# Patient Record
Sex: Male | Born: 1970 | Race: Black or African American | Hispanic: No | Marital: Married | Smoking: Former smoker
Health system: Southern US, Community
[De-identification: ages and names within clinical notes are randomized; demographics above are authoritative.]

## PROBLEM LIST (undated history)

## (undated) DIAGNOSIS — N5089 Other specified disorders of the male genital organs: Secondary | ICD-10-CM

## (undated) DIAGNOSIS — R51 Headache: Secondary | ICD-10-CM

## (undated) DIAGNOSIS — R519 Headache, unspecified: Secondary | ICD-10-CM

## (undated) DIAGNOSIS — N390 Urinary tract infection, site not specified: Secondary | ICD-10-CM

## (undated) DIAGNOSIS — M069 Rheumatoid arthritis, unspecified: Secondary | ICD-10-CM

## (undated) DIAGNOSIS — M1990 Unspecified osteoarthritis, unspecified site: Secondary | ICD-10-CM

## (undated) HISTORY — PX: BACK SURGERY: SHX140

---

## 2000-06-18 ENCOUNTER — Emergency Department (HOSPITAL_COMMUNITY): Admission: EM | Admit: 2000-06-18 | Discharge: 2000-06-18 | Payer: Self-pay | Admitting: *Deleted

## 2000-12-07 ENCOUNTER — Emergency Department (HOSPITAL_COMMUNITY): Admission: EM | Admit: 2000-12-07 | Discharge: 2000-12-07 | Payer: Self-pay | Admitting: Emergency Medicine

## 2000-12-09 ENCOUNTER — Emergency Department (HOSPITAL_COMMUNITY): Admission: EM | Admit: 2000-12-09 | Discharge: 2000-12-09 | Payer: Self-pay | Admitting: Emergency Medicine

## 2000-12-18 ENCOUNTER — Encounter: Payer: Self-pay | Admitting: Nephrology

## 2000-12-18 ENCOUNTER — Encounter: Admission: RE | Admit: 2000-12-18 | Discharge: 2000-12-18 | Payer: Self-pay | Admitting: Nephrology

## 2003-03-18 HISTORY — PX: CERVICAL FUSION: SHX112

## 2003-04-14 ENCOUNTER — Encounter: Admission: RE | Admit: 2003-04-14 | Discharge: 2003-04-14 | Payer: Self-pay | Admitting: Nephrology

## 2003-09-22 ENCOUNTER — Encounter: Admission: RE | Admit: 2003-09-22 | Discharge: 2003-09-22 | Payer: Self-pay | Admitting: Family Medicine

## 2003-11-11 ENCOUNTER — Ambulatory Visit (HOSPITAL_COMMUNITY): Admission: RE | Admit: 2003-11-11 | Discharge: 2003-11-11 | Payer: Self-pay | Admitting: Family Medicine

## 2004-01-02 ENCOUNTER — Ambulatory Visit (HOSPITAL_COMMUNITY): Admission: RE | Admit: 2004-01-02 | Discharge: 2004-01-03 | Payer: Self-pay | Admitting: Neurological Surgery

## 2005-11-09 ENCOUNTER — Encounter: Admission: RE | Admit: 2005-11-09 | Discharge: 2005-11-09 | Payer: Self-pay | Admitting: *Deleted

## 2007-03-18 HISTORY — PX: LUMBAR DISC SURGERY: SHX700

## 2007-03-28 ENCOUNTER — Emergency Department (HOSPITAL_COMMUNITY): Admission: EM | Admit: 2007-03-28 | Discharge: 2007-03-28 | Payer: Self-pay | Admitting: Emergency Medicine

## 2007-04-28 ENCOUNTER — Ambulatory Visit (HOSPITAL_COMMUNITY): Admission: RE | Admit: 2007-04-28 | Discharge: 2007-04-29 | Payer: Self-pay | Admitting: Neurosurgery

## 2008-12-20 ENCOUNTER — Encounter: Admission: RE | Admit: 2008-12-20 | Discharge: 2008-12-20 | Payer: Self-pay | Admitting: Neurology

## 2009-03-17 HISTORY — PX: HIP ARTHROPLASTY: SHX981

## 2009-03-18 ENCOUNTER — Emergency Department (HOSPITAL_COMMUNITY): Admission: EM | Admit: 2009-03-18 | Discharge: 2009-03-18 | Payer: Self-pay | Admitting: Emergency Medicine

## 2009-05-04 ENCOUNTER — Encounter: Admission: RE | Admit: 2009-05-04 | Discharge: 2009-05-04 | Payer: Self-pay | Admitting: Sports Medicine

## 2009-05-30 ENCOUNTER — Inpatient Hospital Stay (HOSPITAL_COMMUNITY): Admission: RE | Admit: 2009-05-30 | Discharge: 2009-06-01 | Payer: Self-pay | Admitting: Orthopedic Surgery

## 2009-06-08 ENCOUNTER — Encounter (INDEPENDENT_AMBULATORY_CARE_PROVIDER_SITE_OTHER): Payer: Self-pay | Admitting: Orthopedic Surgery

## 2009-06-08 ENCOUNTER — Ambulatory Visit: Payer: Self-pay | Admitting: Vascular Surgery

## 2009-06-08 ENCOUNTER — Ambulatory Visit: Admission: RE | Admit: 2009-06-08 | Discharge: 2009-06-08 | Payer: Self-pay | Admitting: Orthopedic Surgery

## 2009-06-19 ENCOUNTER — Encounter: Admission: RE | Admit: 2009-06-19 | Discharge: 2009-06-19 | Payer: Self-pay | Admitting: Family Medicine

## 2009-06-27 DIAGNOSIS — M51379 Other intervertebral disc degeneration, lumbosacral region without mention of lumbar back pain or lower extremity pain: Secondary | ICD-10-CM | POA: Insufficient documentation

## 2009-06-27 DIAGNOSIS — M5137 Other intervertebral disc degeneration, lumbosacral region: Secondary | ICD-10-CM | POA: Insufficient documentation

## 2009-06-28 ENCOUNTER — Ambulatory Visit: Payer: Self-pay | Admitting: Pulmonary Disease

## 2009-06-28 DIAGNOSIS — R930 Abnormal findings on diagnostic imaging of skull and head, not elsewhere classified: Secondary | ICD-10-CM | POA: Insufficient documentation

## 2009-07-23 ENCOUNTER — Inpatient Hospital Stay (HOSPITAL_COMMUNITY): Admission: RE | Admit: 2009-07-23 | Discharge: 2009-07-25 | Payer: Self-pay | Admitting: Orthopedic Surgery

## 2009-08-03 ENCOUNTER — Ambulatory Visit: Admission: RE | Admit: 2009-08-03 | Discharge: 2009-08-03 | Payer: Self-pay | Admitting: Orthopedic Surgery

## 2009-08-03 ENCOUNTER — Encounter (INDEPENDENT_AMBULATORY_CARE_PROVIDER_SITE_OTHER): Payer: Self-pay | Admitting: Orthopedic Surgery

## 2009-08-03 ENCOUNTER — Ambulatory Visit: Payer: Self-pay | Admitting: Vascular Surgery

## 2009-10-21 IMAGING — CT CT PELVIS W/O CM
2 of 4 series · 17 of 46 positions shown, 19 images · IV contrast (agent unspecified)
Comparison: none

CLINICAL DATA: Hematuria, back pain, left flank pain.
 ABDOMEN CT WITHOUT CONTRAST:
TECHNIQUE: Multidetector CT imaging of the abdomen was performed following the standard protocol without IV contrast.
TECHNIQUE: Multidetector CT imaging of the pelvis was performed following the standard protocol without IV contrast.

[Series 2: stone_wo 5.0 b40f st · axial · 0.81mm/px · z∈[-494,-94]mm · 14 of 110 slices shown, 16 images]
[im 5/110  soft-tissue]
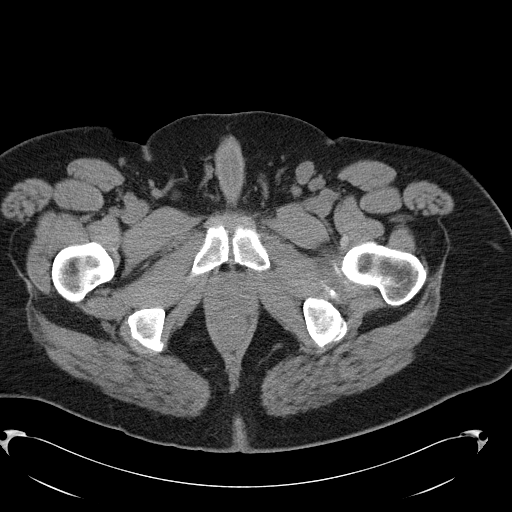
[im 5/110  bone]
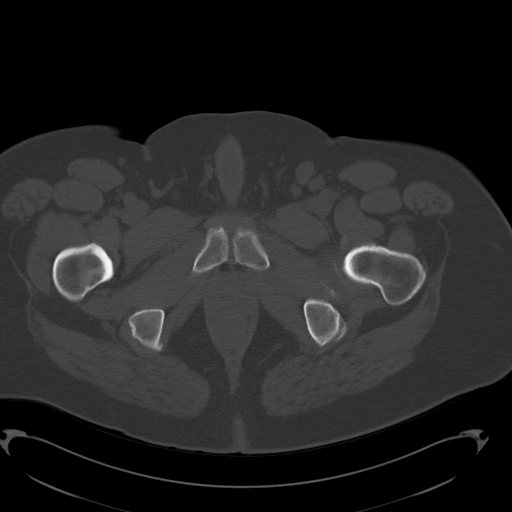
[im 13/110  soft-tissue]
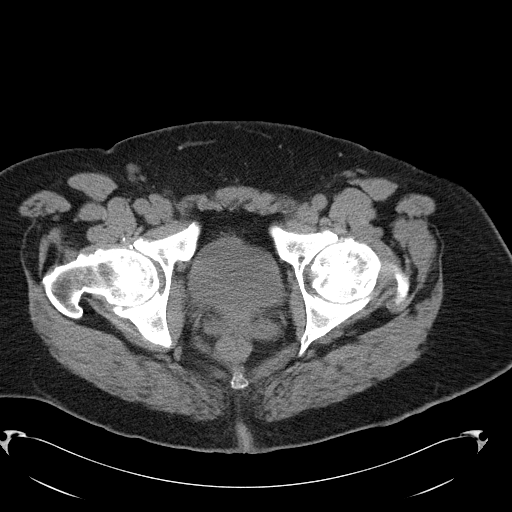
[im 21/110  soft-tissue]
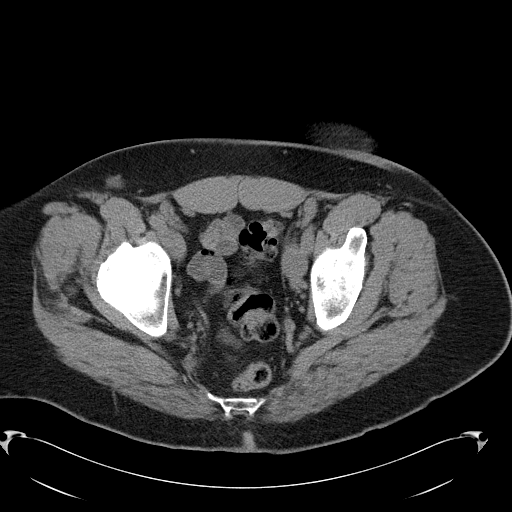
[im 29/110  soft-tissue]
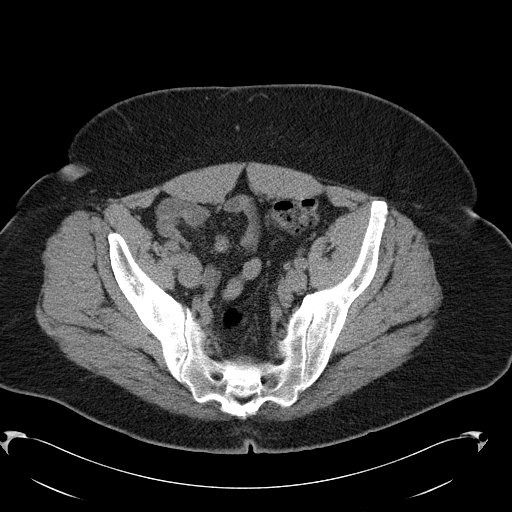
[im 37/110  soft-tissue]
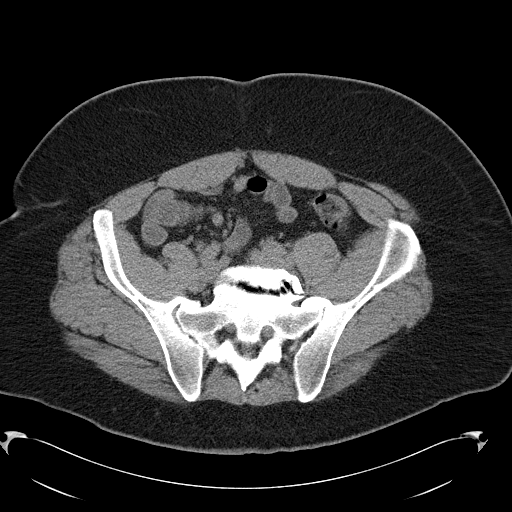
[im 45/110  soft-tissue]
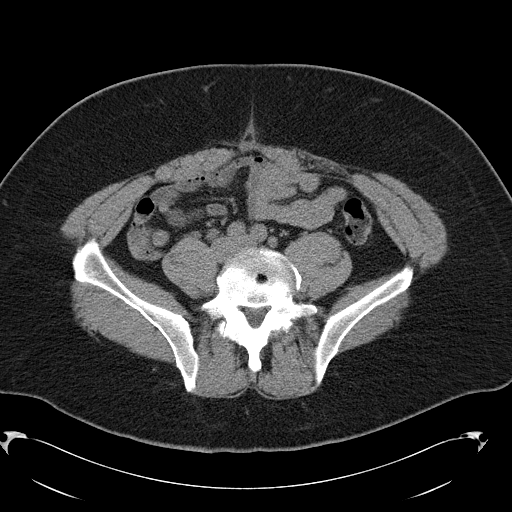
[im 53/110  soft-tissue]
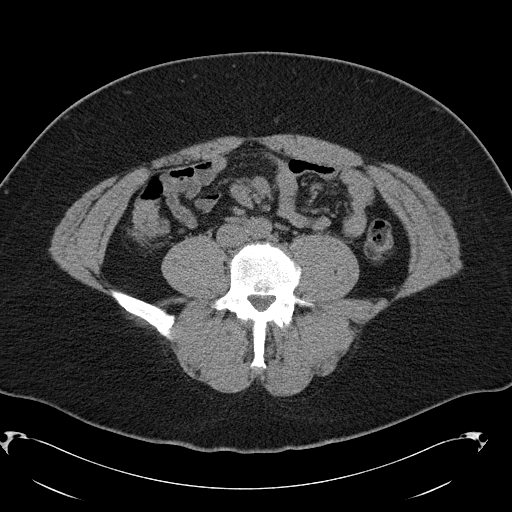
[im 57/110  soft-tissue]
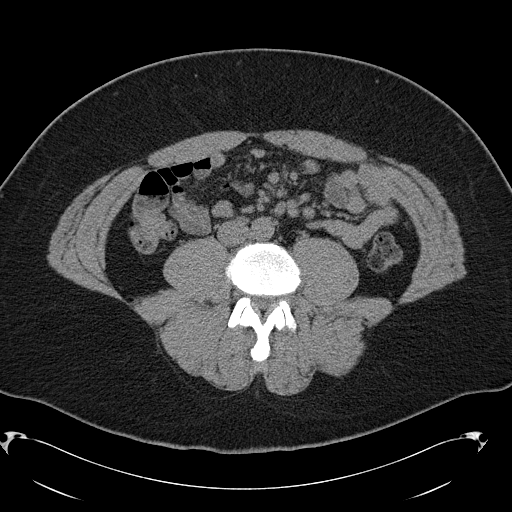
[im 65/110  soft-tissue]
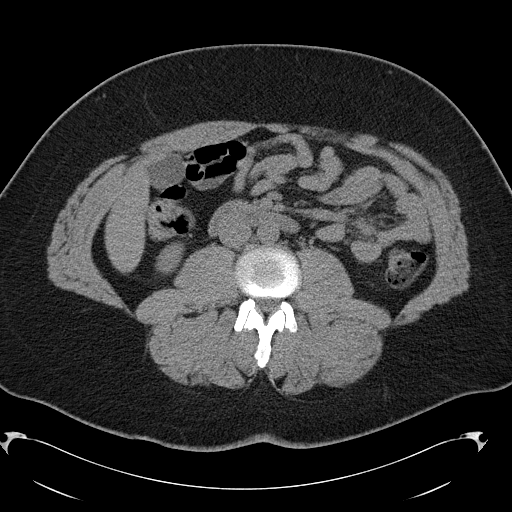
[im 65/110  bone]
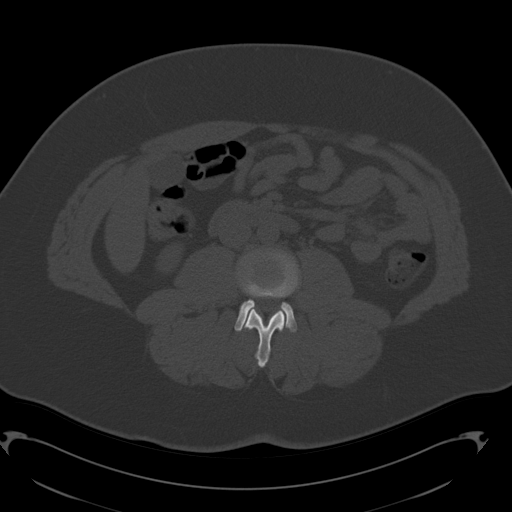
[im 73/110  soft-tissue]
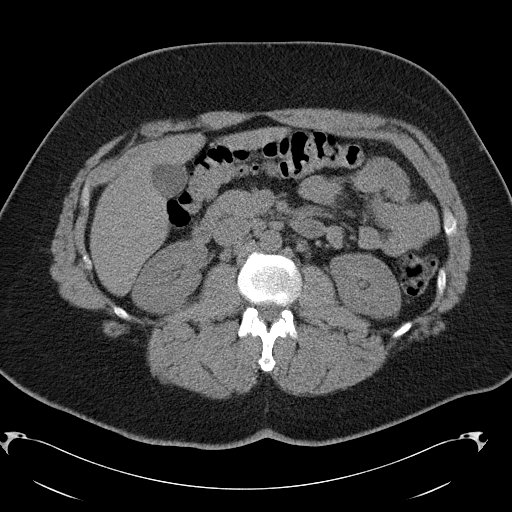
[im 81/110  soft-tissue]
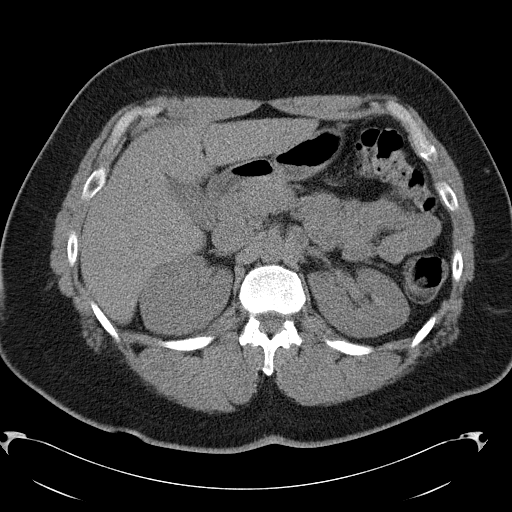
[im 89/110  soft-tissue]
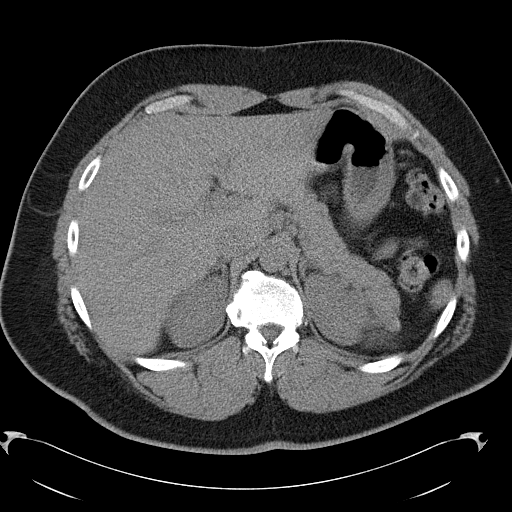
[im 97/110  soft-tissue]
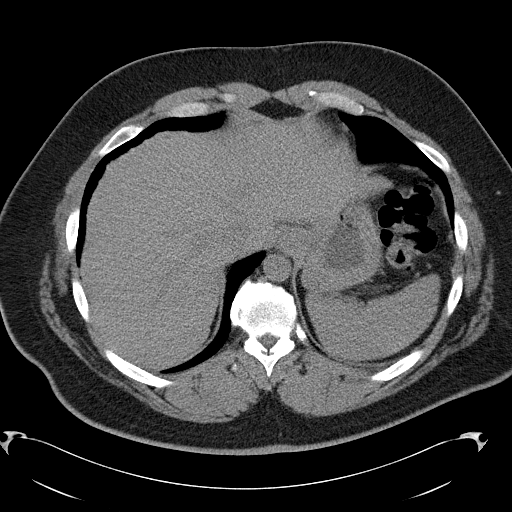
[im 105/110  soft-tissue]
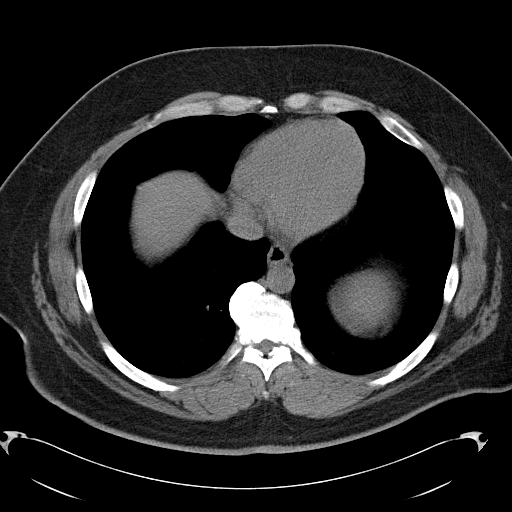

[Series 602: coronal abdomen · coronal · 0.90mm/px · 3 of 139 slices shown]
[im 47/139  soft-tissue]
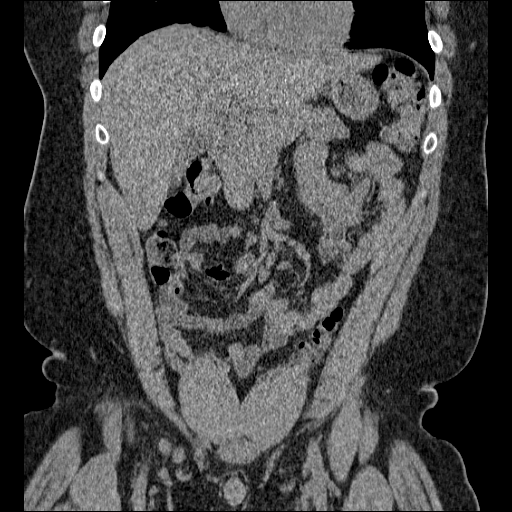
[im 62/139  soft-tissue]
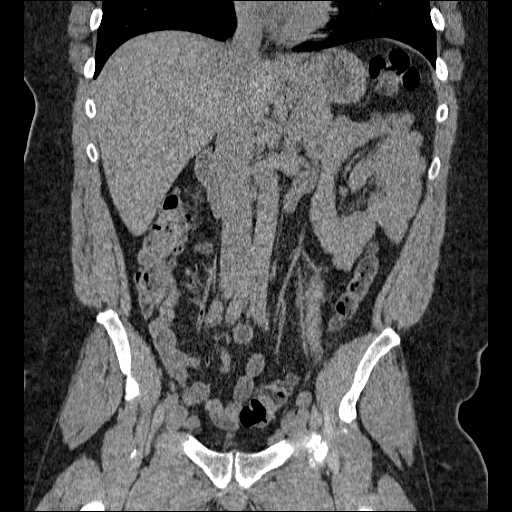
[im 77/139  soft-tissue]
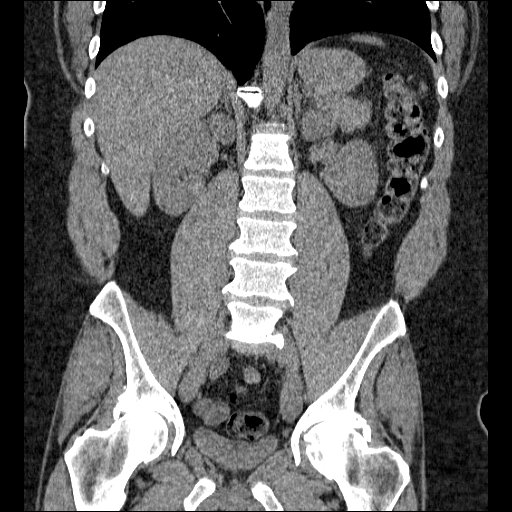

[17 of 46 positions shown; findings below may reference images not displayed]

FINDINGS: There is a 5 mm pleural-based left lower lobe nodule on image 5.  
 There is no hydronephrosis or asymmetric perinephric stranding.  No urinary calculus is present in the abdomen.  
 The liver, gallbladder, spleen, pancreas and adrenal glands are unremarkable.  Negative free fluid.  There is a 1.4 x 2.4 cm soft tissue density adjacent to the aorta on image 30 worrisome for retroperitoneal adenopathy.
IMPRESSION: 1. No urinary calculus.
 2. 5 mm left lower lobe pulmonary nodule.  Followup in three months is recommended to ensure stability.  
 3. Left paraaortic adenopathy.  Inflammatory and neoplastic etiology are in the differential.  At the minimum, followup CT in 3-6 months is warranted to ensure resolution.
 PELVIS CT WITHOUT CONTRAST:
FINDINGS: The appendix is normal.  No urinary calculus is present.   Multifactorial spinal stenosis is noted at L4-5.  There is a left paracentral disc herniation causing severe narrowing of the left lateral recess at L5-S1.  Left-sided facet arthropathy and ligamentum flavum hypertrophy contribute to subarticular lateral recess narrowing.  The disc herniation is somewhat calcified suggesting a chronic nature.  The left S1 nerve root sleeve is effaced.  The bladder and prostate are unremarkable.  Severe degenerative change of the hip joints is present.
IMPRESSION: 1. No urinary calculus.
 2. Degenerative disc disease with spinal stenosis at L4-5.  
 3. Left paracentral disc herniation at L5-S1 compressing the left S1 nerve root sleeve.

## 2010-03-17 HISTORY — PX: BUNIONECTOMY: SHX129

## 2010-04-16 NOTE — Assessment & Plan Note (Signed)
Summary: consult for abnormal ct chest.   Copy to:  Turner Daniels Primary Provider/Referring Provider:  Lupe Carney  CC:  Pulmonary Consult and Abnormal CT pt needs right total hip and needs pulmonary evaluation before surgery.  History of Present Illness: The pt is a 39y/o male who I have been asked to see for an abnormal chest ct.  He recently underwent a left THR, and was noted to have nodular densities on cxr.  He underwent a ct chest, and found to have minimal LN with an upper lobe predominant nodular airspace disease.  He has also been found to have an ACE level of 85, and ESR of 48.  He denies any pulmonary symptoms.  He has no cough, mucus, congestion, or sob.  However, he really does not push himself from an exertional standpoint due to his MSK issues.  He denies any unusual occupational exposures, pets, or mold exposures.  He has worked around Web designer in the past, but has always worn approved respirator devices.  He denies any TB exposure, and has had a negative ppd in the past.  He has no personal history of cancer.  Current Medications (verified): 1)  Baclofen 20 Mg Tabs (Baclofen) .... Take 1 Tablet By Mouth Two Times A Day 2)  Oxycodone Hcl 5 Mg Tabs (Oxycodone Hcl) .... 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  Maternal grandfather heart disease Mother breast Production assistant, radio  Social History: Reviewed history and no changes required. Married with 4 children Disability Former smoker averaged 4 cigarettes per day for 3 years Quit 1998 No alcohol since 1998 , only social  Review of Systems       The patient complains of weight change, tooth/dental problems, and joint stiffness or pain.    Vital Signs:  Patient profile:   40 year old male Height:      76  inches Weight:      328 pounds BMI:     40.07 O2 Sat:      96 % on Room air Temp:     98 degrees F oral Pulse rate:   74 / minute BP sitting:   140 / 84  (left arm)  Vitals Entered By: Renold Genta RCP, LPN (June 28, 2009 3:26 PM)  O2 Sat at Rest %:  96% O2 Flow:  Room air CC: Pulmonary Consult, Abnormal CT pt needs right total hip and needs pulmonary evaluation before surgery Comments Medications reviewed with patient Renold Genta RCP, LPN  June 28, 2009 3:34 PM    Physical Exam  General:  ow male in nad Eyes:  PERRLA and EOMI.   Nose:  patent without discharge Mouth:  clear Neck:  no jvd, tmg, LN Lungs:  totally clear to auscultation Heart:  rrr, no mrg Abdomen:  soft and nontender, bs+ Extremities:  mild edema bilat, no cyanosis pulses intact distally Neurologic:  alert and oriented, moves all 4.   Impression & Recommendations:  Problem # 1:  CT, CHEST, ABNORMAL (ICD-793.1)  The pt has nodular airspace disease bilat the is primarily focused in upper lobes.  The findings, along with is history, is most c/w sarcoidosis.  Would also consider hypersensitivity pneumonitis and eosinophilic granuloma.  At this point, he needs bronchoscopy with BAL and TBBX.  The pt is asymptomatic from  a pulmonary standpoint, is oxygenating well, and is having a lot of hip pain.  I see no reason he cannot have his other hip surgery done before doing procedure for diagnosis of lung disease.  Will send note to Dr. Turner Daniels, and will arrange bronchoscopy in a few weeks.  Other Orders: Consultation Level IV (24401)  Patient Instructions: 1)  please call me week after next to work out time for bronchoscopy.  I will send a note to Dr. Turner Daniels to let him know you are ok for hip surgery.

## 2010-06-02 LAB — URINALYSIS, ROUTINE W REFLEX MICROSCOPIC
Bilirubin Urine: NEGATIVE
Glucose, UA: NEGATIVE mg/dL
Hgb urine dipstick: NEGATIVE
Nitrite: NEGATIVE
Protein, ur: NEGATIVE mg/dL
Urobilinogen, UA: 1 mg/dL (ref 0.0–1.0)

## 2010-06-04 LAB — BASIC METABOLIC PANEL
BUN: 7 mg/dL (ref 6–23)
Chloride: 104 mEq/L (ref 96–112)
Chloride: 107 mEq/L (ref 96–112)
Creatinine, Ser: 0.76 mg/dL (ref 0.4–1.5)
Glucose, Bld: 143 mg/dL — ABNORMAL HIGH (ref 70–99)
Sodium: 142 mEq/L (ref 135–145)

## 2010-06-04 LAB — CBC
HCT: 37.1 % — ABNORMAL LOW (ref 39.0–52.0)
Hemoglobin: 12.4 g/dL — ABNORMAL LOW (ref 13.0–17.0)
MCHC: 33.1 g/dL (ref 30.0–36.0)
MCHC: 33.4 g/dL (ref 30.0–36.0)
MCHC: 33.5 g/dL (ref 30.0–36.0)
MCV: 80.4 fL (ref 78.0–100.0)
MCV: 80.6 fL (ref 78.0–100.0)
Platelets: 147 10*3/uL — ABNORMAL LOW (ref 150–400)
Platelets: 157 10*3/uL (ref 150–400)
Platelets: 193 10*3/uL (ref 150–400)
RBC: 3.64 MIL/uL — ABNORMAL LOW (ref 4.22–5.81)
RDW: 14.9 % (ref 11.5–15.5)
RDW: 15 % (ref 11.5–15.5)
RDW: 15.1 % (ref 11.5–15.5)
WBC: 9.4 10*3/uL (ref 4.0–10.5)

## 2010-06-04 LAB — URINALYSIS, ROUTINE W REFLEX MICROSCOPIC
Bilirubin Urine: NEGATIVE
Glucose, UA: NEGATIVE mg/dL
Ketones, ur: NEGATIVE mg/dL
pH: 5.5 (ref 5.0–8.0)

## 2010-06-04 LAB — URINE MICROSCOPIC-ADD ON

## 2010-06-04 LAB — DIFFERENTIAL
Eosinophils Relative: 4 % (ref 0–5)
Lymphocytes Relative: 20 % (ref 12–46)
Neutro Abs: 1.6 10*3/uL — ABNORMAL LOW (ref 1.7–7.7)
Neutrophils Relative %: 56 % (ref 43–77)

## 2010-06-04 LAB — PROTIME-INR
INR: 1.19 (ref 0.00–1.49)
Prothrombin Time: 13.6 seconds (ref 11.6–15.2)
Prothrombin Time: 14.3 seconds (ref 11.6–15.2)
Prothrombin Time: 15 seconds (ref 11.6–15.2)

## 2010-06-04 LAB — TYPE AND SCREEN

## 2010-06-09 LAB — DIFFERENTIAL
Basophils Absolute: 0 10*3/uL (ref 0.0–0.1)
Lymphocytes Relative: 20 % (ref 12–46)
Lymphs Abs: 0.7 10*3/uL (ref 0.7–4.0)
Neutrophils Relative %: 63 % (ref 43–77)

## 2010-06-09 LAB — BASIC METABOLIC PANEL
BUN: 7 mg/dL (ref 6–23)
CO2: 30 mEq/L (ref 19–32)
Calcium: 8.4 mg/dL (ref 8.4–10.5)
Calcium: 9.5 mg/dL (ref 8.4–10.5)
Chloride: 99 mEq/L (ref 96–112)
Creatinine, Ser: 0.82 mg/dL (ref 0.4–1.5)
GFR calc Af Amer: >60 mL/min (ref >60)
GFR calc non Af Amer: >60 mL/min (ref >60)
GFR calc non Af Amer: >60 mL/min (ref >60)
Glucose, Bld: 91 mg/dL (ref 70–99)
Potassium: 3.8 mEq/L (ref 3.5–5.1)

## 2010-06-09 LAB — URINALYSIS, ROUTINE W REFLEX MICROSCOPIC
Glucose, UA: NEGATIVE mg/dL
Hgb urine dipstick: NEGATIVE
Ketones, ur: NEGATIVE mg/dL
Protein, ur: NEGATIVE mg/dL
Urobilinogen, UA: 0.2 mg/dL (ref 0.0–1.0)

## 2010-06-09 LAB — PROTIME-INR
INR: 0.97 (ref 0.00–1.49)
INR: 1.11 (ref 0.00–1.49)
Prothrombin Time: 12.8 seconds (ref 11.6–15.2)
Prothrombin Time: 14.2 seconds (ref 11.6–15.2)
Prothrombin Time: 16.3 seconds — ABNORMAL HIGH (ref 11.6–15.2)

## 2010-06-09 LAB — CBC
Platelets: 134 10*3/uL — ABNORMAL LOW (ref 150–400)
Platelets: 147 10*3/uL — ABNORMAL LOW (ref 150–400)
Platelets: 152 10*3/uL (ref 150–400)
RDW: 14.7 % (ref 11.5–15.5)
RDW: 15 % (ref 11.5–15.5)
WBC: 3.4 10*3/uL — ABNORMAL LOW (ref 4.0–10.5)
WBC: 9.2 10*3/uL (ref 4.0–10.5)

## 2010-06-09 LAB — TYPE AND SCREEN: ABO/RH(D): O NEG

## 2010-06-09 LAB — ABO/RH: ABO/RH(D): O NEG

## 2010-07-30 NOTE — Op Note (Signed)
NAMEERYK, BEAVERS               ACCOUNT NO.:  [id number]   MEDICAL RECORD NO.:  [id number]          PATIENT TYPE:  OIB   LOCATION:  3524                         FACILITY:  [facility]   PHYSICIAN:  Donalee Citrin, M.D.        DATE OF BIRTH:  04-Mar-1971   DATE OF PROCEDURE:  04/28/2007  DATE OF DISCHARGE:  04/29/2007                               OPERATIVE REPORT   PREOPERATIVE DIAGNOSIS:  Lumbar spondylosis with ruptured disc L5-S1,  left, with left sided S1 radiculopathy.   POSTOPERATIVE DIAGNOSIS:  Lumbar spondylosis with ruptured disc L5-S1,  left, with left sided S1 radiculopathy.   PROCEDURE:  Laminectomy and microdiscectomy L5-S1, left, microdissection  left S1 nerve root, microdiscectomy.   SURGEON:  Donalee Citrin, M.D.   ASSISTANT:  Tia Alert, M.D.   ANESTHESIA:  General endotracheal anesthesia.   HISTORY OF PRESENT ILLNESS:  The patient is a very pleasant 40 year old  gentleman who has had long standing back and predominantly left leg  pain, he also has right, but mostly on his left, radiating down the back  of his leg.  MRI scan showed a large ruptured disc, partially calcified,  on the left side L5-S1. The patient was recommended laminectomy and  microdiscectomy.  The risks and benefits of the operation were explained  to the patient.  He understood and agreed to proceed forth.   DESCRIPTION OF PROCEDURE:  The patient was brought to the OR and was  induced under general anesthesia.  He was positioned prone on the Wilson  frame.  His back was prepped and draped in the usual sterile fashion.  An x-ray localized the L5-S1 disc space and after infiltration of 10 mL  of lidocaine with epinephrine, a midline incision was made and Bovie  electrocautery was used to take down subperiosteal dissection carried  out to the lamina of L5-S1 on the left side.  Intraoperative x-ray  confirmed the dissection at the appropriate level.  Then, the inferior  aspect of L5, medial facet  complex, and superior aspect of S1, was  drilled with high speed drill and then using 2 and 3 Kerrison punch, the  remainder of the laminotomy was completed.  The ligamentum flavum was  removed in piecemeal fashion exposing the thecal sac.  The S1 nerve was  immediately identified and noted to be markedly swollen and thickened  and splayed out over a very large disc herniation still contained with a  ligament and what appeared to be largely calcified.  So, at this point,  the operating microscope was draped, brought into the field, and under  microscopic illumination, the under surface of the lateral gutter was  underbitten with a 2 mm Kerrison punch to gain lateral access to the  disc space, using a nerve hook, identifying the S1 pedicle, and the  densely adherent plane underneath the S1 nerve root overlying the  calcified disc herniation was developed with a nerve hook and a 4  Penfield and then the S1 nerve root was reflected medially with the  Durico.  The annulotomy was incised, several large fragments of disc  were removed.  Then a large calcified spur coming off the superior  endplate of S1.  This was pushed down with a downgoing Epstein curette  and the remainder of the disc space was cleaned out. At the end of the  discectomy, there was no further stenosis.  The under surface of the S1  nerve root was, again, noted to be markedly swollen but no further  fragments appreciated.  The S1 axilla was explored, also, and noted be  free and clear of any fragments of disc and the S1 nerve was completely  decompressed easily accepting a nerve hook, coronary dilator, and angled  hockey stick out the foramen.  The hockey stick and coronary dilator  also were passed across the midline very easily with no stenosis  appreciated. At the end of the discectomy, the wound was then copiously  irrigated, meticulous hemostasis was maintained.  Gelfoam was laid on  top of the dura.  The muscle and  fascia were reapproximated with  interrupted Vicryl and the skin was closed with a running 4-0  subcuticular.  Benzoin and Steri-Strips were applied.  The patient went  to the recovery room in stable condition.  At the end of the case, all  counts were correct.           ______________________________  Donalee Citrin, M.D.     GC/MEDQ  D:  04/28/2007  T:  04/29/2007  Job:  [id number]

## 2010-08-02 NOTE — Op Note (Signed)
Charles Peck, Charles Peck               ACCOUNT NO.:  [id number]   MEDICAL RECORD NO.:  [id number]          PATIENT TYPE:  OIB   LOCATION:  3011                         FACILITY:  [facility]   PHYSICIAN:  Stefani Dama, M.D.  DATE OF BIRTH:  1970/06/26   DATE OF PROCEDURE:  01/02/2004  DATE OF DISCHARGE:                                 OPERATIVE REPORT   PREOPERATIVE DIAGNOSIS:  C5-C6 cervical spondylosis with myelopathy,  spondylosis C6-C7.   POSTOPERATIVE DIAGNOSIS:  C5-C6 cervical spondylosis with myelopathy,  spondylosis C6-C7.   PROCEDURE:  Anterior cervical decompression and arthrodesis C5-C6 and C6-C7,  structural allograft Synthes Alphatek plate fixation.   SURGEON:  Stefani Dama, M.D.   FIRST ASSISTANT:  Clydene Fake, M.D.   ANESTHESIA:  General endotracheal anesthesia.   INDICATIONS FOR PROCEDURE:  Mr. Hollenbaugh is a 40 year old individual who has  had significant neck, shoulder, right arm pain and weakness, and abnormal  gait with spasticity.  The patient has severe compression of the spinal cord  secondary to degenerative changes at C5-C6 with a broad base disc protrusion  and large bony spurs overgrowing the disc space, itself.  The patient was  advised regarding surgical decompression and stabilization.  Plain  radiographs demonstrate the patient has large overgrown osteophytes in the  front of the vertebra between C5, C6, and C7.   PROCEDURE:  The patient was brought to the operating room and placed on the  table in supine position.  After the smooth induction of general  endotracheal anesthesia, he was placed in 5 pounds of halter traction.  The  neck was shaved, prepped with DuraPrep, and draped in a sterile fashion.  A  transverse incision was created in the neck and dissection was carried down  down to the prevertebral space.  The first identifiable disc space was noted  to be that of C5-C6 and C6-C7.  A needle was placed at the anterior border  of C6.  As the  procedure continued, it was noted that large ventral  osteophytes needed to be cleared and this was done with a Kerrison and  Leksell punch removing the osteophytes as the osteophytectomy proceeded.  The disc space that was first felt to be identified at C5-C6 was ultimately  noted to be that of the C6-C7 level below, however, from removal of all the  ventral osteophytes, it was felt this level would now be unstable.  Therefore, a discectomy was then created at C6-C7 and at C5-C6 removing  significant quantity of severely degenerated disc material until the  posterior longitudinal ligament was reached.  The disc space was emptied of  some bony overgrowth which was drilled down with a high speed air drill and  2.3 mm dissecting tool.  The dissection was carried out to the lateral  recesses.  An 8 mm trans-graft was shaped into the appropriate shape, fitted  with some of the bony pieces that were removed from the osteophytes, and  packed also with demineralized bone matrix.  This was placed into the  interspace.  Attention was turned to C5-C6 where a similar procedure  was  carried out here and an 8 mm graft was also fitted.  However, there was  noted to be a much more prominent osteophyte from the inferior margin of the  body of C5.  This was drilled down and cleared away.  The dural tube and C6  nerve roots were cleared bilaterally.  Once hemostasis was achieved, an 8 mm  trans-graft was again fitted with some bony chips from the osteophytectomy  and also fitted with demineralized bone matrix.  This was placed into the  interspace and counter sunk.  A 37 mm Alphatek plate was then fitted to the  ventral aspect of the vertebral bodies and locked with six locking screws,  two of them were fixed angle in the body of C7 and the other  four were variable angle in C5 and C6.  Hemostasis in the soft tissues were  then doubly checked and the platysma was closed with 3-0 Vicryl in an  interrupted  fashion, 3-0 Vicryl was used subcuticularly.  The patient  tolerated the procedure well and was returned to the recovery room in stable  condition.       HJE/MEDQ  D:  01/02/2004  T:  01/02/2004  Job:  [id number]

## 2010-12-05 LAB — APTT: aPTT: 32

## 2010-12-05 LAB — DIFFERENTIAL
Basophils Absolute: 0
Basophils Relative: 0
Eosinophils Absolute: 0.1
Eosinophils Relative: 2
Lymphocytes Relative: 13
Lymphs Abs: 0.6 — ABNORMAL LOW
Monocytes Absolute: 0.9
Monocytes Relative: 19 — ABNORMAL HIGH
Neutro Abs: 3.1
Neutrophils Relative %: 66

## 2010-12-05 LAB — CBC
HCT: 40.2
Hemoglobin: 13.6
MCHC: 33.7
MCV: 77.9 — ABNORMAL LOW
Platelets: 178
RBC: 5.16
RDW: 13.8
WBC: 4.6

## 2010-12-05 LAB — URINE MICROSCOPIC-ADD ON

## 2010-12-05 LAB — BASIC METABOLIC PANEL
BUN: 18
Chloride: 103
Glucose, Bld: 103 — ABNORMAL HIGH
Potassium: 3.9

## 2010-12-05 LAB — URINE CULTURE
Colony Count: NO GROWTH
Culture: NO GROWTH

## 2010-12-05 LAB — BASIC METABOLIC PANEL WITH GFR
CO2: 29
Calcium: 9.8
Creatinine, Ser: 0.91
GFR calc Af Amer: >60
GFR calc non Af Amer: >60
Sodium: 139

## 2010-12-05 LAB — URINALYSIS, ROUTINE W REFLEX MICROSCOPIC
Glucose, UA: NEGATIVE
Nitrite: NEGATIVE
Protein, ur: 100 — AB
Specific Gravity, Urine: 1.045 — ABNORMAL HIGH
Urobilinogen, UA: 1
pH: 5

## 2010-12-05 LAB — PROTIME-INR
INR: 1.1
Prothrombin Time: 13.9

## 2010-12-06 LAB — CBC
HCT: 44.9
Hemoglobin: 15
MCHC: 33.3
MCV: 78.8
RBC: 5.7

## 2011-12-23 ENCOUNTER — Other Ambulatory Visit: Payer: Self-pay | Admitting: Orthopedic Surgery

## 2011-12-30 ENCOUNTER — Encounter (HOSPITAL_COMMUNITY): Payer: Self-pay | Admitting: Pharmacy Technician

## 2012-01-05 ENCOUNTER — Encounter (HOSPITAL_COMMUNITY): Payer: Self-pay

## 2012-01-05 ENCOUNTER — Encounter (HOSPITAL_COMMUNITY)
Admission: RE | Admit: 2012-01-05 | Discharge: 2012-01-05 | Disposition: A | Discharge status: Home / Self Care | Payer: BC Managed Care – PPO | Source: Ambulatory Visit | Attending: Orthopedic Surgery | Admitting: Orthopedic Surgery

## 2012-01-05 ENCOUNTER — Ambulatory Visit (HOSPITAL_COMMUNITY)
Admission: RE | Admit: 2012-01-05 | Discharge: 2012-01-05 | Disposition: A | Discharge status: Home / Self Care | Payer: BC Managed Care – PPO | Source: Ambulatory Visit | Attending: Orthopedic Surgery | Admitting: Orthopedic Surgery

## 2012-01-05 DIAGNOSIS — Z01818 Encounter for other preprocedural examination: Secondary | ICD-10-CM | POA: Insufficient documentation

## 2012-01-05 DIAGNOSIS — Z01812 Encounter for preprocedural laboratory examination: Secondary | ICD-10-CM | POA: Insufficient documentation

## 2012-01-05 HISTORY — DX: Unspecified osteoarthritis, unspecified site: M19.90

## 2012-01-05 LAB — CBC WITH DIFFERENTIAL/PLATELET
Basophils Absolute: 0 10*3/uL (ref 0.0–0.1)
Basophils Relative: 1 % (ref 0–1)
Eosinophils Absolute: 0.2 10*3/uL (ref 0.0–0.7)
MCH: 26.4 pg (ref 26.0–34.0)
MCHC: 32.5 g/dL (ref 30.0–36.0)
Neutrophils Relative %: 47 % (ref 43–77)
Platelets: 173 10*3/uL (ref 150–400)

## 2012-01-05 LAB — PROTIME-INR
INR: 1.01 (ref 0.00–1.49)
Prothrombin Time: 13.2 seconds (ref 11.6–15.2)

## 2012-01-05 LAB — URINALYSIS, ROUTINE W REFLEX MICROSCOPIC
Bilirubin Urine: NEGATIVE
Glucose, UA: NEGATIVE mg/dL
Hgb urine dipstick: NEGATIVE
Ketones, ur: NEGATIVE mg/dL
Leukocytes, UA: NEGATIVE
pH: 5.5 (ref 5.0–8.0)

## 2012-01-05 LAB — BASIC METABOLIC PANEL
BUN: 9 mg/dL (ref 6–23)
Calcium: 9.6 mg/dL (ref 8.4–10.5)
GFR calc Af Amer: >90 mL/min (ref >90)
GFR calc non Af Amer: >90 mL/min (ref >90)
Glucose, Bld: 95 mg/dL (ref 70–99)
Sodium: 138 mEq/L (ref 135–145)

## 2012-01-05 LAB — SURGICAL PCR SCREEN: MRSA, PCR: NEGATIVE

## 2012-01-05 NOTE — Pre-Procedure Instructions (Signed)
[name]  01/05/2012   Your procedure is scheduled on:  Monday January 12, 2012  Report to [facility] Short Stay Center at 12:00PM.  Call this number if you have problems the morning of surgery: [phone]   Remember:   Do not eat food or drink :After Midnight.      Take these medicines the morning of surgery with A SIP OF WATER: baclofen, oxycodone   Do not wear jewelry, make-up or nail polish.  Do not wear lotions, powders, or perfumes.  Do not shave 48 hours prior to surgery. Men may shave face and neck.  Do not bring valuables to the hospital.  Contacts, dentures or bridgework may not be worn into surgery.  Leave suitcase in the car. After surgery it may be brought to your room.  For patients admitted to the hospital, checkout time is 11:00 AM the day of discharge.   Patients discharged the day of surgery will not be allowed to drive home.  Name and phone number of your driver: family / friend   Special Instructions: Incentive Spirometry - Practice and bring it with you on the day of surgery. Shower using CHG 2 nights before surgery and the night before surgery.  If you shower the day of surgery use CHG.  Use special wash - you have one bottle of CHG for all showers.  You should use approximately 1/3 of the bottle for each shower.   Please read over the following fact sheets that you were given: Pain Booklet, Coughing and Deep Breathing, Blood Transfusion Information, Total Joint Packet, MRSA Information and Surgical Site Infection Prevention

## 2012-01-06 NOTE — H&P (Signed)
TOTAL KNEE ADMISSION H&P  Patient is being admitted for right total knee arthroplasty.  Subjective:  Chief Complaint:right knee pain.  HPI: Charles Peck, 41 y.o. male, has a history of pain and functional disability in the right knee due to arthritis and has failed non-surgical conservative treatments for greater than 12 weeks to includeNSAID's and/or analgesics, corticosteriod injections and activity modification.  Onset of symptoms was gradual, starting 5 years ago with gradually worsening course since that time. The patient noted no past surgery on the right knee(s).  Patient currently rates pain in the right knee(s) at 8 out of 10 with activity. Patient has night pain, worsening of pain with activity and weight bearing, pain that interferes with activities of daily living and joint swelling.  Patient has evidence of subchondral cysts and joint space narrowing by imaging studies. There is no active infection.  Patient Active Problem List   Diagnosis Date Noted  . Nonspecific (abnormal) findings on radiological and other examination of body structure 06/28/2009  . CT, CHEST, ABNORMAL 06/28/2009  . DISC DISEASE, LUMBAR 06/27/2009   Past Medical History  Diagnosis Date  . Degenerative joint disease     Past Surgical History  Procedure Date  . Cervical fusion 2005  . Lumbar disc surgery 2009  . Hip arthroplasty 2011    bilateral  . Bunionectomy 2012    also foot surgery to both feet    No prescriptions prior to admission   No Known Allergies  History  Substance Use Topics  . Smoking status: Never Smoker   . Smokeless tobacco: Never Used  . Alcohol Use: No    No family history on file.   Review of Systems  Constitutional: Negative.   HENT: Negative.   Eyes: Negative.   Respiratory: Negative.   Gastrointestinal: Negative.   Genitourinary: Negative.   Musculoskeletal: Positive for myalgias, back pain and joint pain.  Skin: Negative.   Neurological: Negative.     Endo/Heme/Allergies: Negative.   Psychiatric/Behavioral: Negative.     Objective:  Physical Exam  Constitutional: He is oriented to person, place, and time. He appears well-developed and well-nourished.  HENT:  Head: Normocephalic.  Eyes: Pupils are equal, round, and reactive to light.  Cardiovascular: Normal heart sounds.   Respiratory: Breath sounds normal.  Musculoskeletal:       Right knee: He exhibits decreased range of motion and effusion. tenderness found. Lateral joint line tenderness noted.  Neurological: He is alert and oriented to person, place, and time.  Psychiatric: He has a normal mood and affect.    Vital signs in last 24 hours: Temp:  [98.4 F (36.9 C)] 98.4 F (36.9 C) (10/21 1021) Pulse Rate:  [72] 72  (10/21 1021) Resp:  [20] 20  (10/21 1021) BP: (158)/(98) 158/98 mmHg (10/21 1021) SpO2:  [95 %] 95 % (10/21 1021) Weight:  [151.501 kg (334 lb)] 151.501 kg (334 lb) (10/21 1021)  Labs:   Estimated Body mass index is 39.93 kg/(m^2) as calculated from the following:   Height as of 06/28/09: 6\' 4" (1.93 m).   Weight as of 06/28/09: 328 lb(148.78 kg).   Imaging Review Plain radiographs demonstrate severe degenerative joint disease of the right knee(s). The overall alignment ismild valgus. The bone quality appears to be adequate for age and reported activity level.  Assessment/Plan:  End stage arthritis, right knee   The patient history, physical examination, clinical judgment of the provider and imaging studies are consistent with end stage degenerative joint disease of the right  knee(s) and total knee arthroplasty is deemed medically necessary. The treatment options including medical management, injection therapy arthroscopy and arthroplasty were discussed at length. The risks and benefits of total knee arthroplasty were presented and reviewed. The risks due to aseptic loosening, infection, stiffness, patella tracking problems, thromboembolic complications and  other imponderables were discussed. The patient acknowledged the explanation, agreed to proceed with the plan and consent was signed. Patient is being admitted for inpatient treatment for surgery, pain control, PT, OT, prophylactic antibiotics, VTE prophylaxis, progressive ambulation and ADL's and discharge planning. The patient is planning to be discharged home with home health services. We may need an MBT tray because his weight is over 300 pounds.

## 2012-01-09 NOTE — Progress Notes (Signed)
Patient notified of surgery time change to 0900 10/28 and time to arrive at short stay to 0700. Patient verbalized understanding.

## 2012-01-11 MED ORDER — DEXTROSE 5 % IV SOLN
3.0000 g | INTRAVENOUS | Status: AC
  Administered 2012-01-12: 3 g via INTRAVENOUS
  Filled 2012-01-11: qty 3000

## 2012-01-12 ENCOUNTER — Encounter (HOSPITAL_COMMUNITY): Payer: Self-pay | Admitting: Anesthesiology

## 2012-01-12 ENCOUNTER — Inpatient Hospital Stay (HOSPITAL_COMMUNITY)
Admission: RE | Admit: 2012-01-12 | Discharge: 2012-01-15 | DRG: 209 | Disposition: A | Discharge status: Home Health | Payer: BC Managed Care – PPO | Source: Ambulatory Visit | Attending: Orthopedic Surgery | Admitting: Orthopedic Surgery

## 2012-01-12 ENCOUNTER — Encounter (HOSPITAL_COMMUNITY): Payer: Self-pay | Admitting: *Deleted

## 2012-01-12 ENCOUNTER — Ambulatory Visit (HOSPITAL_COMMUNITY): Payer: BC Managed Care – PPO | Admitting: Anesthesiology

## 2012-01-12 ENCOUNTER — Encounter (HOSPITAL_COMMUNITY)
Admission: RE | Disposition: A | Discharge status: Home Health | Payer: Self-pay | Source: Ambulatory Visit | Attending: Orthopedic Surgery

## 2012-01-12 DIAGNOSIS — Z96649 Presence of unspecified artificial hip joint: Secondary | ICD-10-CM

## 2012-01-12 DIAGNOSIS — M1711 Unilateral primary osteoarthritis, right knee: Secondary | ICD-10-CM | POA: Diagnosis present

## 2012-01-12 DIAGNOSIS — M1710 Unilateral primary osteoarthritis, unspecified knee: Principal | ICD-10-CM | POA: Diagnosis present

## 2012-01-12 HISTORY — PX: TOTAL KNEE ARTHROPLASTY: SHX125

## 2012-01-12 SURGERY — ARTHROPLASTY, KNEE, TOTAL
Anesthesia: General | Site: Knee | Laterality: Right | Wound class: Clean

## 2012-01-12 MED ORDER — ACETAMINOPHEN 10 MG/ML IV SOLN
INTRAVENOUS | Status: AC
  Filled 2012-01-12: qty 100

## 2012-01-12 MED ORDER — MENTHOL 3 MG MT LOZG: 1.0000 | LOZENGE | OROMUCOSAL | Status: DC | PRN

## 2012-01-12 MED ORDER — FLEET ENEMA 7-19 GM/118ML RE ENEM: 1.0000 | ENEMA | Freq: Once | RECTAL | Status: AC | PRN

## 2012-01-12 MED ORDER — ACETAMINOPHEN 10 MG/ML IV SOLN
1000.0000 mg | Freq: Once | INTRAVENOUS | Status: AC
  Administered 2012-01-12: 1000 mg via INTRAVENOUS
  Filled 2012-01-12: qty 100

## 2012-01-12 MED ORDER — PROMETHAZINE HCL 25 MG/ML IJ SOLN: 6.2500 mg | INTRAMUSCULAR | Status: DC | PRN

## 2012-01-12 MED ORDER — MIDAZOLAM HCL 5 MG/5ML IJ SOLN
INTRAMUSCULAR | Status: DC | PRN
  Administered 2012-01-12: 2 mg via INTRAVENOUS

## 2012-01-12 MED ORDER — DIPHENHYDRAMINE HCL 12.5 MG/5ML PO ELIX: 12.5000 mg | ORAL_SOLUTION | Freq: Four times a day (QID) | ORAL | Status: DC | PRN

## 2012-01-12 MED ORDER — SODIUM CHLORIDE 0.9 % IJ SOLN: 9.0000 mL | INTRAMUSCULAR | Status: DC | PRN

## 2012-01-12 MED ORDER — OXYCODONE HCL 5 MG PO TABS: 5.0000 mg | ORAL_TABLET | Freq: Once | ORAL | Status: DC | PRN

## 2012-01-12 MED ORDER — ONDANSETRON HCL 4 MG/2ML IJ SOLN: 4.0000 mg | Freq: Four times a day (QID) | INTRAMUSCULAR | Status: DC | PRN

## 2012-01-12 MED ORDER — CELECOXIB 200 MG PO CAPS
200.0000 mg | ORAL_CAPSULE | Freq: Two times a day (BID) | ORAL | Status: DC
  Administered 2012-01-12 – 2012-01-15 (×7): 200 mg via ORAL
  Filled 2012-01-12 (×8): qty 1

## 2012-01-12 MED ORDER — PHENOL 1.4 % MT LIQD: 1.0000 | OROMUCOSAL | Status: DC | PRN

## 2012-01-12 MED ORDER — ALUM & MAG HYDROXIDE-SIMETH 200-200-20 MG/5ML PO SUSP: 30.0000 mL | ORAL | Status: DC | PRN

## 2012-01-12 MED ORDER — ASPIRIN EC 325 MG PO TBEC
325.0000 mg | DELAYED_RELEASE_TABLET | Freq: Two times a day (BID) | ORAL | Status: DC
  Administered 2012-01-12 – 2012-01-15 (×7): 325 mg via ORAL
  Filled 2012-01-12 (×8): qty 1

## 2012-01-12 MED ORDER — OXYCODONE HCL 5 MG PO TABS: 5.0000 mg | ORAL_TABLET | ORAL | Status: DC | PRN

## 2012-01-12 MED ORDER — DIPHENHYDRAMINE HCL 12.5 MG/5ML PO ELIX: 12.5000 mg | ORAL_SOLUTION | ORAL | Status: DC | PRN

## 2012-01-12 MED ORDER — ONDANSETRON HCL 4 MG/2ML IJ SOLN
INTRAMUSCULAR | Status: DC | PRN
  Administered 2012-01-12: 4 mg via INTRAVENOUS

## 2012-01-12 MED ORDER — VECURONIUM BROMIDE 10 MG IV SOLR
INTRAVENOUS | Status: DC | PRN
  Administered 2012-01-12: 10 mg via INTRAVENOUS

## 2012-01-12 MED ORDER — OXYCODONE HCL 5 MG PO TABS
15.0000 mg | ORAL_TABLET | ORAL | Status: DC | PRN
  Administered 2012-01-14 – 2012-01-15 (×8): 15 mg via ORAL
  Filled 2012-01-12 (×8): qty 3

## 2012-01-12 MED ORDER — LACTATED RINGERS IV SOLN
INTRAVENOUS | Status: DC | PRN
  Administered 2012-01-12 (×2): via INTRAVENOUS

## 2012-01-12 MED ORDER — NEOSTIGMINE METHYLSULFATE 1 MG/ML IJ SOLN
INTRAMUSCULAR | Status: DC | PRN
  Administered 2012-01-12: 4 mg via INTRAVENOUS

## 2012-01-12 MED ORDER — MAGNESIUM HYDROXIDE 400 MG/5ML PO SUSP: 30.0000 mL | Freq: Every day | ORAL | Status: DC | PRN

## 2012-01-12 MED ORDER — CEFUROXIME SODIUM 1.5 G IJ SOLR
INTRAMUSCULAR | Status: AC
  Filled 2012-01-12: qty 1.5

## 2012-01-12 MED ORDER — ACETAMINOPHEN 10 MG/ML IV SOLN
1000.0000 mg | Freq: Four times a day (QID) | INTRAVENOUS | Status: AC
  Administered 2012-01-12 – 2012-01-13 (×4): 1000 mg via INTRAVENOUS
  Filled 2012-01-12 (×5): qty 100

## 2012-01-12 MED ORDER — NALOXONE HCL 0.4 MG/ML IJ SOLN: 0.4000 mg | INTRAMUSCULAR | Status: DC | PRN

## 2012-01-12 MED ORDER — OXYCODONE HCL 5 MG/5ML PO SOLN: 5.0000 mg | Freq: Once | ORAL | Status: DC | PRN

## 2012-01-12 MED ORDER — GLYCOPYRROLATE 0.2 MG/ML IJ SOLN
INTRAMUSCULAR | Status: DC | PRN
  Administered 2012-01-12: .8 mg via INTRAVENOUS

## 2012-01-12 MED ORDER — SODIUM CHLORIDE 0.9 % IR SOLN
Status: DC | PRN
  Administered 2012-01-12: 3000 mL

## 2012-01-12 MED ORDER — HYDROMORPHONE HCL PF 1 MG/ML IJ SOLN
0.2500 mg | INTRAMUSCULAR | Status: DC | PRN
  Administered 2012-01-12 (×2): 0.5 mg via INTRAVENOUS

## 2012-01-12 MED ORDER — METOCLOPRAMIDE HCL 10 MG PO TABS: 5.0000 mg | ORAL_TABLET | Freq: Three times a day (TID) | ORAL | Status: DC | PRN

## 2012-01-12 MED ORDER — HYDROMORPHONE HCL PF 1 MG/ML IJ SOLN: 1.0000 mg | INTRAMUSCULAR | Status: DC | PRN

## 2012-01-12 MED ORDER — HYDROMORPHONE HCL PF 1 MG/ML IJ SOLN
INTRAMUSCULAR | Status: AC
  Filled 2012-01-12: qty 2

## 2012-01-12 MED ORDER — LIDOCAINE HCL (CARDIAC) 20 MG/ML IV SOLN
INTRAVENOUS | Status: DC | PRN
  Administered 2012-01-12: 50 mg via INTRAVENOUS

## 2012-01-12 MED ORDER — PROPOFOL 10 MG/ML IV BOLUS
INTRAVENOUS | Status: DC | PRN
  Administered 2012-01-12: 200 mg via INTRAVENOUS

## 2012-01-12 MED ORDER — CHLORHEXIDINE GLUCONATE 4 % EX LIQD: 60.0000 mL | Freq: Once | CUTANEOUS | Status: DC

## 2012-01-12 MED ORDER — MORPHINE SULFATE 10 MG/ML IJ SOLN
INTRAMUSCULAR | Status: DC | PRN
  Administered 2012-01-12 (×2): 5 mg via INTRAVENOUS

## 2012-01-12 MED ORDER — FENTANYL CITRATE 0.05 MG/ML IJ SOLN
INTRAMUSCULAR | Status: DC | PRN
  Administered 2012-01-12 (×4): 50 ug via INTRAVENOUS
  Administered 2012-01-12: 100 ug via INTRAVENOUS

## 2012-01-12 MED ORDER — METOCLOPRAMIDE HCL 5 MG/ML IJ SOLN: 5.0000 mg | Freq: Three times a day (TID) | INTRAMUSCULAR | Status: DC | PRN

## 2012-01-12 MED ORDER — MEPERIDINE HCL 25 MG/ML IJ SOLN: 6.2500 mg | INTRAMUSCULAR | Status: DC | PRN

## 2012-01-12 MED ORDER — OXYCODONE HCL 5 MG PO TABS: 15.0000 mg | ORAL_TABLET | ORAL | Status: DC | PRN

## 2012-01-12 MED ORDER — DEXTROSE-NACL 5-0.45 % IV SOLN: INTRAVENOUS | Status: DC

## 2012-01-12 MED ORDER — ARTIFICIAL TEARS OP OINT
TOPICAL_OINTMENT | OPHTHALMIC | Status: DC | PRN
  Administered 2012-01-12: 1 via OPHTHALMIC

## 2012-01-12 MED ORDER — ACETAMINOPHEN 325 MG PO TABS: 650.0000 mg | ORAL_TABLET | Freq: Four times a day (QID) | ORAL | Status: DC | PRN

## 2012-01-12 MED ORDER — DIPHENHYDRAMINE HCL 50 MG/ML IJ SOLN: 12.5000 mg | Freq: Four times a day (QID) | INTRAMUSCULAR | Status: DC | PRN

## 2012-01-12 MED ORDER — ACETAMINOPHEN 650 MG RE SUPP: 650.0000 mg | Freq: Four times a day (QID) | RECTAL | Status: DC | PRN

## 2012-01-12 MED ORDER — KCL IN DEXTROSE-NACL 20-5-0.45 MEQ/L-%-% IV SOLN
INTRAVENOUS | Status: DC
  Administered 2012-01-13: 13:00:00 via INTRAVENOUS
  Filled 2012-01-12 (×13): qty 1000

## 2012-01-12 MED ORDER — BACLOFEN 20 MG PO TABS
20.0000 mg | ORAL_TABLET | Freq: Three times a day (TID) | ORAL | Status: DC
  Administered 2012-01-12 – 2012-01-15 (×9): 20 mg via ORAL
  Filled 2012-01-12 (×11): qty 1

## 2012-01-12 MED ORDER — ZOLPIDEM TARTRATE 5 MG PO TABS: 5.0000 mg | ORAL_TABLET | Freq: Every evening | ORAL | Status: DC | PRN

## 2012-01-12 MED ORDER — ONDANSETRON HCL 4 MG PO TABS: 4.0000 mg | ORAL_TABLET | Freq: Four times a day (QID) | ORAL | Status: DC | PRN

## 2012-01-12 MED ORDER — HYDROMORPHONE 0.3 MG/ML IV SOLN
INTRAVENOUS | Status: AC
  Administered 2012-01-12: 25 mL via INTRAVENOUS
  Filled 2012-01-12: qty 25

## 2012-01-12 MED ORDER — BISACODYL 5 MG PO TBEC: 5.0000 mg | DELAYED_RELEASE_TABLET | Freq: Every day | ORAL | Status: DC | PRN

## 2012-01-12 MED ORDER — MIDAZOLAM HCL 2 MG/2ML IJ SOLN: 0.5000 mg | Freq: Once | INTRAMUSCULAR | Status: DC | PRN

## 2012-01-12 MED ORDER — CEFUROXIME SODIUM 1.5 G IJ SOLR
INTRAMUSCULAR | Status: DC | PRN
  Administered 2012-01-12: 1.5 g

## 2012-01-12 MED ORDER — HYDROMORPHONE 0.3 MG/ML IV SOLN
INTRAVENOUS | Status: DC
  Administered 2012-01-12: 20:00:00 via INTRAVENOUS
  Administered 2012-01-12: 0.9 mg via INTRAVENOUS
  Administered 2012-01-12: 2.7 mg via INTRAVENOUS
  Administered 2012-01-12: 25 mL via INTRAVENOUS
  Administered 2012-01-13: 2.4 mg via INTRAVENOUS
  Administered 2012-01-13: 19:00:00 via INTRAVENOUS
  Administered 2012-01-13: 2.4 mg via INTRAVENOUS
  Administered 2012-01-13: 2.2 mg via INTRAVENOUS
  Administered 2012-01-13: 09:00:00 via INTRAVENOUS
  Administered 2012-01-13 (×2): 2.1 mg via INTRAVENOUS
  Administered 2012-01-14: 3.2 mg via INTRAVENOUS
  Administered 2012-01-14: 3.4 mg via INTRAVENOUS
  Administered 2012-01-14: 3.9 mg via INTRAVENOUS
  Administered 2012-01-14: 03:00:00 via INTRAVENOUS
  Filled 2012-01-12 (×4): qty 25

## 2012-01-12 SURGICAL SUPPLY — 57 items
BANDAGE ESMARK 6X9 LF (GAUZE/BANDAGES/DRESSINGS) ×1 IMPLANT
BLADE SAG 18X100X1.27 (BLADE) ×2 IMPLANT
BLADE SAW SGTL 13X75X1.27 (BLADE) ×2 IMPLANT
BLADE SURG ROTATE 9660 (MISCELLANEOUS) IMPLANT
BNDG CMPR MED 10X6 ELC LF (GAUZE/BANDAGES/DRESSINGS) ×1
BNDG ELASTIC 6X10 VLCR STRL LF (GAUZE/BANDAGES/DRESSINGS) ×2 IMPLANT
BNDG ESMARK 6X9 LF (GAUZE/BANDAGES/DRESSINGS) ×2
BOWL SMART MIX CTS (DISPOSABLE) ×2 IMPLANT
CEMENT HV SMART SET (Cement) ×4 IMPLANT
CEMENT TIBIA MBT SIZE 6 (Knees) ×1 IMPLANT
CLOTH BEACON ORANGE TIMEOUT ST (SAFETY) ×2 IMPLANT
COVER BACK TABLE 24X17X13 BIG (DRAPES) IMPLANT
COVER SURGICAL LIGHT HANDLE (MISCELLANEOUS) ×2 IMPLANT
CUFF TOURNIQUET SINGLE 34IN LL (TOURNIQUET CUFF) IMPLANT
CUFF TOURNIQUET SINGLE 44IN (TOURNIQUET CUFF) ×2 IMPLANT
DRAPE EXTREMITY T 121X128X90 (DRAPE) ×2 IMPLANT
DRAPE U-SHAPE 47X51 STRL (DRAPES) ×2 IMPLANT
DURAPREP 26ML APPLICATOR (WOUND CARE) ×2 IMPLANT
ELECT REM PT RETURN 9FT ADLT (ELECTROSURGICAL) ×2
ELECTRODE REM PT RTRN 9FT ADLT (ELECTROSURGICAL) ×1 IMPLANT
EVACUATOR 1/8 PVC DRAIN (DRAIN) ×2 IMPLANT
FEMORAL POSTERIOR SZ 5 (Knees) ×2 IMPLANT
GAUZE XEROFORM 1X8 LF (GAUZE/BANDAGES/DRESSINGS) ×2 IMPLANT
GLOVE BIO SURGEON STRL SZ7 (GLOVE) ×2 IMPLANT
GLOVE BIO SURGEON STRL SZ7.5 (GLOVE) ×2 IMPLANT
GLOVE BIOGEL PI IND STRL 7.0 (GLOVE) ×1 IMPLANT
GLOVE BIOGEL PI IND STRL 8 (GLOVE) ×1 IMPLANT
GLOVE BIOGEL PI INDICATOR 7.0 (GLOVE) ×1
GLOVE BIOGEL PI INDICATOR 8 (GLOVE) ×1
GOWN PREVENTION PLUS XLARGE (GOWN DISPOSABLE) ×2 IMPLANT
GOWN STRL NON-REIN LRG LVL3 (GOWN DISPOSABLE) ×4 IMPLANT
HANDPIECE INTERPULSE COAX TIP (DISPOSABLE) ×1
HOOD PEEL AWAY FACE SHEILD DIS (HOOD) ×6 IMPLANT
KIT BASIN OR (CUSTOM PROCEDURE TRAY) ×2 IMPLANT
KIT ROOM TURNOVER OR (KITS) ×2 IMPLANT
MANIFOLD NEPTUNE II (INSTRUMENTS) ×2 IMPLANT
NS IRRIG 1000ML POUR BTL (IV SOLUTION) ×2 IMPLANT
PACK TOTAL JOINT (CUSTOM PROCEDURE TRAY) ×2 IMPLANT
PAD ARMBOARD 7.5X6 YLW CONV (MISCELLANEOUS) ×4 IMPLANT
PADDING CAST COTTON 6X4 STRL (CAST SUPPLIES) ×2 IMPLANT
PATELLA DOME PFC 41MM (Knees) ×2 IMPLANT
PLATE ROT INSERT 10MM SIZE 5 (Plate) ×2 IMPLANT
SET HNDPC FAN SPRY TIP SCT (DISPOSABLE) ×1 IMPLANT
SPONGE GAUZE 4X4 12PLY (GAUZE/BANDAGES/DRESSINGS) ×4 IMPLANT
STAPLER VISISTAT 35W (STAPLE) ×2 IMPLANT
SUCTION FRAZIER TIP 10 FR DISP (SUCTIONS) ×2 IMPLANT
SUT VIC AB 0 CTX 36 (SUTURE) ×1
SUT VIC AB 0 CTX36XBRD ANTBCTR (SUTURE) ×1 IMPLANT
SUT VIC AB 1 CTX 36 (SUTURE) ×1
SUT VIC AB 1 CTX36XBRD ANBCTR (SUTURE) ×1 IMPLANT
SUT VIC AB 2-0 CT1 27 (SUTURE) ×1
SUT VIC AB 2-0 CT1 TAPERPNT 27 (SUTURE) ×1 IMPLANT
TIBIA MBT CEMENT SIZE 6 (Knees) ×2 IMPLANT
TOWEL OR 17X24 6PK STRL BLUE (TOWEL DISPOSABLE) ×2 IMPLANT
TOWEL OR 17X26 10 PK STRL BLUE (TOWEL DISPOSABLE) ×2 IMPLANT
TRAY FOLEY CATH 14FR (SET/KITS/TRAYS/PACK) IMPLANT
WATER STERILE IRR 1000ML POUR (IV SOLUTION) ×6 IMPLANT

## 2012-01-12 NOTE — Progress Notes (Signed)
Orthopedic Tech Progress Note Patient Details:  Charles Peck Jan 26, 1971 [id number]  CPM Right Knee CPM Right Knee: On Right Knee Flexion (Degrees): 60  Right Knee Extension (Degrees): 0  Additional Comments: TRAPEZE BAR   Shawnie Pons 01/12/2012, 2:45 PM

## 2012-01-12 NOTE — Evaluation (Signed)
Physical Therapy Evaluation Patient Details Name: Charles Peck MRN: [id number] DOB: 30-Dec-1970 Today's Date: 01/12/2012 Time: 0102-7253 PT Time Calculation (min): 24 min  PT Assessment / Plan / Recommendation Clinical Impression  Pt POD 0 from R TKA. Patient educated on importance of OOB mobility and what to expect from PT/OT. Handout of ther ex provided with good return demonstration from patient. Patient reports desire to return home with spouse and have HHPT. Patient and spouse concerned about stair negotiation. Instructed patient that PT would practice with them.    PT Assessment  Patient needs continued PT services    Follow Up Recommendations  Home health PT    Does the patient have the potential to tolerate intense rehabilitation      Barriers to Discharge Inaccessible home environment (12 steps to enter home)      Equipment Recommendations  3 in 1 bedside comode    Recommendations for Other Services     Frequency 7X/week    Precautions / Restrictions Precautions Precautions: Knee Precaution Booklet Issued: Yes (comment) Required Braces or Orthoses:  (when up OOB) Restrictions Weight Bearing Restrictions: Yes RLE Weight Bearing: Weight bearing as tolerated   Pain:  7/10 R knee      Mobility  Bed Mobility Bed Mobility:  (patient spouse deferred OOB mobility)    Shoulder Instructions     Exercises Total Joint Exercises Ankle Circles/Pumps: AROM;Both;10 reps;Supine Quad Sets: AROM;Right;10 reps;Supine Knee Flexion: AROM;Right;10 reps;Supine   PT Diagnosis: Difficulty walking  PT Problem List: Decreased strength;Decreased range of motion;Decreased activity tolerance;Decreased mobility PT Treatment Interventions: DME instruction;Gait training;Stair training;Functional mobility training;Therapeutic activities;Therapeutic exercise   PT Goals Acute Rehab PT Goals PT Goal Formulation: With patient Time For Goal Achievement: 01/19/12 Potential to Achieve  Goals: Good Pt will go Supine/Side to Sit: Independently;with HOB 0 degrees PT Goal: Supine/Side to Sit - Progress: Goal set today Pt will go Sit to Stand: with modified independence;with upper extremity assist (up to RW.) PT Goal: Sit to Stand - Progress: Goal set today Pt will Ambulate: 51 - 150 feet;with rolling walker;with supervision PT Goal: Ambulate - Progress: Goal set today Pt will Go Up / Down Stairs: Flight;with supervision;with rail(s) PT Goal: Up/Down Stairs - Progress: Goal set today Pt will Perform Home Exercise Program: Independently PT Goal: Perform Home Exercise Program - Progress: Goal set today  Visit Information  Last PT Received On: 01/12/12 Assistance Needed: +1    Subjective Data  Subjective: Pt received supine in bed with 7/10 pain. Wife deferred OOB mobility. patient agreed to LE ther ex in bed.   Prior Functioning  Home Living Lives With: Spouse Available Help at Discharge: Family;Available 24 hours/day Type of Home: Apartment Home Access: Stairs to enter [employer] of Steps: 12 Entrance Stairs-Rails: Can reach both Home Layout: One level Bathroom Shower/Tub: Engineer, manufacturing systems: Standard Bathroom Accessibility: Yes How Accessible: Accessible via walker Home Adaptive Equipment: Walker - rolling Prior Function Level of Independence: Independent Able to Take Stairs?: Yes Driving: Yes Vocation: Full time employment Comments: job requires Midwife of standing Communication Communication: No difficulties Dominant Hand: Right    Cognition  Overall Cognitive Status: Appears within functional limits for tasks assessed/performed Arousal/Alertness: Awake/alert Orientation Level: Appears intact for tasks assessed Behavior During Session: [facility] for tasks performed    Extremity/Trunk Assessment Right Upper Extremity Assessment RUE ROM/Strength/Tone: Within functional levels Left Upper Extremity Assessment LUE  ROM/Strength/Tone: Within functional levels Right Lower Extremity Assessment RLE ROM/Strength/Tone:  (able to attempt quad  set and assist with hip/knee flexion) Left Lower Extremity Assessment LLE ROM/Strength/Tone: Within functional levels   Balance    End of Session PT - End of Session Activity Tolerance: Patient tolerated treatment well Patient left: in bed;in CPM;with call bell/phone within reach;with family/visitor present Nurse Communication: Mobility status CPM Right Knee CPM Right Knee: On Right Knee Flexion (Degrees): 60  Right Knee Extension (Degrees): 0  Additional Comments: TRAPEZE BAR  GP    Lewis Shock, PT, DPT Pager #: [phone] Office #: [phone]  Marcene Brawn 01/12/2012, 3:35 PM

## 2012-01-12 NOTE — Op Note (Signed)
PATIENT ID:      Charles Peck  MRN:     [id number] DOB/AGE:    Jan 09, 1971 / 41 y.o.       OPERATIVE REPORT    DATE OF PROCEDURE:  01/12/2012       PREOPERATIVE DIAGNOSIS:   RIGHT KNEE DEGENERATIVE JOINT DISEASE      Estimated Body mass index is 39.93 kg/(m^2) as calculated from the following:   Height as of 06/28/09: 6\' 4" (1.93 m).   Weight as of 06/28/09: 328 lb(148.78 kg).                                                        POSTOPERATIVE DIAGNOSIS:   right knee degenerative joint disease                                                                      PROCEDURE:  Procedure(s): TOTAL KNEE ARTHROPLASTY Using Depuy Sigma RP implants #5R Femur, #6Tibia, 10mm sigma RP bearing, 41 Patella     SURGEON: Harpreet Signore J    ASSISTANT:   Shirl Harris PA-C   (Present and scrubbed throughout the case, critical for assistance with exposure, retraction, instrumentation, and closure.)         ANESTHESIA: GET with Femoral Nerve Block  DRAINS: foley, 2 medium hemovac in knee   TOURNIQUET TIME:   COMPLICATIONS:  None     SPECIMENS: None   INDICATIONS FOR PROCEDURE: The patient has  RIGHT KNEE DEGENERATIVE JOINT DISEASE, valgus deformities, XR shows bone on bone arthritis. Patient has failed all conservative measures including anti-inflammatory medicines, narcotics, attempts at  exercise and weight loss, cortisone injections and viscosupplementation.  Risks and benefits of surgery have been discussed, questions answered.   DESCRIPTION OF PROCEDURE: The patient identified by armband, received  right femoral nerve block and IV antibiotics, in the holding area at [facility]. Patient taken to the operating room, appropriate anesthetic  monitors were attached General endotracheal anesthesia induced with  the patient in supine position, Foley catheter was inserted. Tourniquet  applied high to the operative thigh. Lateral post and foot positioner  applied to the table, the lower  extremity was then prepped and draped  in usual sterile fashion from the ankle to the tourniquet. Time-out procedure was performed. The limb was wrapped with an Esmarch bandage and the tourniquet inflated to 350 mmHg. We began the operation by making the anterior midline incision starting at handbreadth above the patella going over the patella 1 cm medial to and  4 cm distal to the tibial tubercle. Small bleeders in the skin and the  subcutaneous tissue identified and cauterized. Transverse retinaculum was incised and reflected medially and a medial parapatellar arthrotomy was accomplished. the patella was everted and theprepatellar fat pad resected. The superficial medial collateral  ligament was then elevated from anterior to posterior along the proximal  flare of the tibia and anterior half of the menisci resected. The knee was hyperflexed exposing bone on bone arthritis. Peripheral and notch osteophytes as well as the cruciate ligaments were then resected. We  continued to  work our way around posteriorly along the proximal tibia, and externally  rotated the tibia subluxing it out from underneath the femur. A McHale  retractor was placed through the notch and a lateral Hohmann retractor  placed, and we then drilled through the proximal tibia in line with the  axis of the tibia followed by an intramedullary guide rod and 2-degree  posterior slope cutting guide. The tibial cutting guide was pinned into place  allowing resection of 10 mm of bone medially and about 2 mm of bone  laterally because of her varus deformity. Satisfied with the tibial resection, we then  entered the distal femur 2 mm anterior to the PCL origin with the  intramedullary guide rod and applied the distal femoral cutting guide  set at 11mm, with 5 degrees of valgus. This was pinned along the  epicondylar axis. At this point, the distal femoral cut was accomplished without difficulty. We then sized for a #5R femoral component  and pinned the guide in 0 degrees of external rotation.The chamfer cutting guide was pinned into place. The anterior, posterior, and chamfer cuts were accomplished without difficulty followed by  the Sigma RP box cutting guide and the box cut. We also removed posterior osteophytes from the posterior femoral condyles. At this  time, the knee was brought into full extension. We checked our  extension and flexion gaps and found them symmetric at 10mm.  The patella thickness measured at 25 mm. We set the cutting guide at 15 and removed the posterior 9.5-10 mm  of the patella sized for 41 button and drilled the lollipop. The knee  was then once again hyperflexed exposing the proximal tibia. We sized for a #6 tibial base plate, applied the smokestack and the conical reamer followed by the the Delta fin keel punch. We then hammered into place the Sigma RP trial femoral component, inserted a 10-mm trial bearing, trial patellar button, and took the knee through range of motion from 0-130 degrees. Because of the valgus deformity the patella subluxed laterally with flexion. This required a fairly generous lateral release starting just behind courteous tubercle and going proximally for 6 cm for patellar  tracking. At this point, all trial components were removed, a double batch of DePuy HV cement with 1500 mg of Zinacef was mixed and applied to all bony metallic mating surfaces except for the posterior condyles of the femur itself. In order, we  hammered into place the tibial tray and removed excess cement, the femoral component and removed excess cement, a 10-mm Sigma RP bearing  was inserted, and the knee brought to full extension with compression.  The patellar button was clamped into place, and excess cement  removed. While the cement cured the wound was irrigated out with normal saline solution pulse lavage, and medium Hemovac drains were placed from an anterolateral  approach. Ligament stability and patellar  tracking were checked and found to be excellent. The parapatellar arthrotomy was closed with  running #1 Vicryl suture. The subcutaneous tissue with 0 and 2-0 undyed  Vicryl suture, and the skin with skin staples. A dressing of Xeroform,  4 x 4, dressing sponges, Webril, and Ace wrap applied. The patient  awakened, extubated, and taken to recovery room without difficulty.   Hubert Raatz J 01/12/2012, 11:10 AM

## 2012-01-12 NOTE — Anesthesia Procedure Notes (Signed)
Procedure Name: Intubation Date/Time: 01/12/2012 9:34 AM Performed by: Carmela Rima Pre-anesthesia Checklist: Patient identified, Timeout performed, Emergency Drugs available, Suction available and Patient being monitored Patient Re-evaluated:Patient Re-evaluated prior to inductionOxygen Delivery Method: Circle system utilized Preoxygenation: Pre-oxygenation with 100% oxygen Intubation Type: IV induction Ventilation: Mask ventilation without difficulty Grade View: Grade II Tube type: Oral Tube size: 8.0 mm Number of attempts: 1 Placement Confirmation: ETT inserted through vocal cords under direct vision,  breath sounds checked- equal and bilateral and positive ETCO2 Secured at: 23 cm Tube secured with: Tape Dental Injury: Dental damage  Comments: Loosened already poor tooth on left front

## 2012-01-12 NOTE — Progress Notes (Signed)
NOTIFIED PAM IN OR OF PATIENT'S WEIGHT 334 LB.

## 2012-01-12 NOTE — Interval H&P Note (Signed)
History and Physical Interval Note:  01/12/2012 9:15 AM  Charles Peck  has presented today for surgery, with the diagnosis of RIGHT KNEE DEGENERATIVE JOINT DISEASE  The various methods of treatment have been discussed with the patient and family. After consideration of risks, benefits and other options for treatment, the patient has consented to  Procedure(s) (LRB) with comments: TOTAL KNEE ARTHROPLASTY (Right) as a surgical intervention .  The patient's history has been reviewed, patient examined, no change in status, stable for surgery.  I have reviewed the patient's chart and labs.  Questions were answered to the patient's satisfaction.     Nestor Lewandowsky

## 2012-01-12 NOTE — Preoperative (Signed)
Beta Blockers   Reason not to administer Beta Blockers:Not Applicable 

## 2012-01-12 NOTE — Anesthesia Postprocedure Evaluation (Signed)
  Anesthesia Post-op Note  Patient: Charles Peck  Procedure(s) Performed: Procedure(s) (LRB) with comments: TOTAL KNEE ARTHROPLASTY (Right)  Patient Location: PACU  Anesthesia Type: No value filed.   Level of Consciousness: awake, alert , oriented and pateint uncooperative  Airway and Oxygen Therapy: Patient Spontanous Breathing and Patient connected to nasal cannula oxygen  Post-op Pain: mild  Post-op Assessment: Post-op Vital signs reviewed, Patient's Cardiovascular Status Stable, Respiratory Function Stable, Patent Airway, No signs of Nausea or vomiting and Pain level controlled  Post-op Vital Signs: Reviewed and stable  Complications: No apparent anesthesia complications

## 2012-01-12 NOTE — Plan of Care (Signed)
Problem: Consults Goal: Diagnosis- Total Joint Replacement Primary Total Knee Right     

## 2012-01-12 NOTE — Anesthesia Preprocedure Evaluation (Addendum)
Anesthesia Evaluation  Patient identified by MRN, date of birth, ID band Patient awake    Reviewed: Allergy & Precautions, H&P , NPO status , Patient's Chart, lab work & pertinent test results  History of Anesthesia Complications Negative for: history of anesthetic complications  Airway Mallampati: III TM Distance: >3 FB Neck ROM: full    Dental  (+) Poor Dentition and Dental Advidsory Given   Pulmonary neg pulmonary ROS,          Cardiovascular negative cardio ROS      Neuro/Psych negative neurological ROS     GI/Hepatic negative GI ROS, Neg liver ROS,   Endo/Other  Morbid obesity  Renal/GU negative Renal ROS     Musculoskeletal   Abdominal   Peds  Hematology   Anesthesia Other Findings Dental advisory per Dr. Krista Blue.    Reproductive/Obstetrics                          Anesthesia Physical Anesthesia Plan  ASA: II  Anesthesia Plan: General   Post-op Pain Management:    Induction:   Airway Management Planned: LMA  Additional Equipment:   Intra-op Plan:   Post-operative Plan: Extubation in OR  Informed Consent: I have reviewed the patients History and Physical, chart, labs and discussed the procedure including the risks, benefits and alternatives for the proposed anesthesia with the patient or authorized representative who has indicated his/her understanding and acceptance.   Dental advisory given and Dental Advisory Given  Plan Discussed with: CRNA, Anesthesiologist and Surgeon  Anesthesia Plan Comments:        Anesthesia Quick Evaluation

## 2012-01-12 NOTE — Transfer of Care (Signed)
Immediate Anesthesia Transfer of Care Note  Patient: Charles Peck  Procedure(s) Performed: Procedure(s) (LRB) with comments: TOTAL KNEE ARTHROPLASTY (Right)  Patient Location: PACU  Anesthesia Type:No value filed.  Level of Consciousness: awake, alert  and oriented  Airway & Oxygen Therapy: Patient Spontanous Breathing and Patient connected to nasal cannula oxygen  Post-op Assessment: Report given to PACU RN, Post -op Vital signs reviewed and stable and Patient moving all extremities X 4  Post vital signs: Reviewed and stable  Complications: No apparent anesthesia complications

## 2012-01-13 ENCOUNTER — Encounter (HOSPITAL_COMMUNITY): Payer: Self-pay | Admitting: General Practice

## 2012-01-13 LAB — BASIC METABOLIC PANEL
CO2: 29 mEq/L (ref 19–32)
Chloride: 103 mEq/L (ref 96–112)
Creatinine, Ser: 0.79 mg/dL (ref 0.50–1.35)
Glucose, Bld: 127 mg/dL — ABNORMAL HIGH (ref 70–99)

## 2012-01-13 LAB — CBC
HCT: 33.8 % — ABNORMAL LOW (ref 39.0–52.0)
Hemoglobin: 10.7 g/dL — ABNORMAL LOW (ref 13.0–17.0)
MCH: 25.8 pg — ABNORMAL LOW (ref 26.0–34.0)
MCV: 81.4 fL (ref 78.0–100.0)
Platelets: 160 10*3/uL (ref 150–400)
RBC: 4.15 MIL/uL — ABNORMAL LOW (ref 4.22–5.81)
WBC: 11.8 10*3/uL — ABNORMAL HIGH (ref 4.0–10.5)

## 2012-01-13 NOTE — Addendum Note (Signed)
Addendum  created 01/13/12 0936 by Germaine Pomfret, MD   Modules edited:Notes Section

## 2012-01-13 NOTE — Evaluation (Signed)
Occupational Therapy Evaluation and Discharge Patient Details Name: Charles Peck MRN: [id number] DOB: 1970/06/16 Today's Date: 01/13/2012 Time: 3086-5784 OT Time Calculation (min): 19 min  OT Assessment / Plan / Recommendation Clinical Impression  This 41 yo s/p RTKA presents to acute OT with all education completed and will have prn A at home. Will D/C from acute OT.    OT Assessment  Patient does not need any further OT services    Follow Up Recommendations  No OT follow up       Equipment Recommendations  3 in 1 bedside comode (wide)          Precautions / Restrictions Precautions Precautions: Knee Restrictions RLE Weight Bearing: Weight bearing as tolerated   Pertinent Vitals/Pain 3/10 RLE     ADL  Eating/Feeding: Simulated;Independent Where Assessed - Eating/Feeding: Chair Grooming: Simulated;Set up;Supervision/safety Where Assessed - Grooming: Unsupported standing Upper Body Bathing: Simulated;Supervision/safety Where Assessed - Upper Body Bathing: Unsupported sitting Lower Body Bathing: Simulated;Moderate assistance Where Assessed - Lower Body Bathing: Supported sit to stand Upper Body Dressing: Simulated;Set up;Supervision/safety Where Assessed - Upper Body Dressing: Unsupported sitting Lower Body Dressing: Simulated;Moderate assistance Where Assessed - Lower Body Dressing: Supported sit to Pharmacist, hospital: Mining engineer Method: Sit to Barista:  (recliner, around 1 section of 4N back to bed) Toileting - Clothing Manipulation and Hygiene: Simulated;Min guard Where Assessed - Glass blower/designer Manipulation and Hygiene: Standing Equipment Used: Rolling walker;Gait belt Transfers/Ambulation Related to ADLs: Min A for all ADL Comments: Pt is going to wait until he can step into the tub, until then he will sponge bath          Visit Information  Last OT Received On: 01/13/12 Assistance  Needed: +1 PT/OT Co-Evaluation/Treatment: Yes (partial)    Subjective Data  Subjective: I just sponged bathed with my hips until I could get into the shower Patient Stated Goal: Did not ask   Prior Functioning     Home Living Lives With: Spouse Available Help at Discharge: Family;Available 24 hours/day Type of Home: Apartment Home Access: Stairs to enter [employer] of Steps: 12 Entrance Stairs-Rails: Can reach both Home Layout: One level Bathroom Shower/Tub: Forensic scientist: Standard Bathroom Accessibility: Yes How Accessible: Accessible via walker Home Adaptive Equipment: Walker - rolling;Reacher;Sock aid;Long-handled shoehorn;Long-handled sponge Prior Function Level of Independence: Independent Able to Take Stairs?: Yes Driving: Yes Vocation: Full time employment Communication Communication: No difficulties Dominant Hand: Right            Cognition  Overall Cognitive Status: Appears within functional limits for tasks assessed/performed Arousal/Alertness: Awake/alert Orientation Level: Appears intact for tasks assessed Behavior During Session: [facility] for tasks performed    Extremity/Trunk Assessment Right Upper Extremity Assessment RUE ROM/Strength/Tone: Within functional levels Left Upper Extremity Assessment LUE ROM/Strength/Tone: Within functional levels     Mobility Transfers Transfers: Sit to Stand;Stand to Sit Sit to Stand: 4: Min assist;With upper extremity assist;With armrests;From chair/3-in-1 Stand to Sit: 4: Min guard;With upper extremity assist;To bed              End of Session OT - End of Session Equipment Utilized During Treatment: Gait belt (RW) Activity Tolerance: Patient tolerated treatment well Patient left: in bed (sitting EOB with PT in room)       Evette Georges [phone] 01/13/2012, 2:02 PM

## 2012-01-13 NOTE — Progress Notes (Signed)
Physical Therapy Treatment Patient Details Name: Charles Peck MRN: [id number] DOB: [birth year] Today's Date: 01/13/2012 Time: 0865-7846 PT Time Calculation (min): 19 min  PT Assessment / Plan / Recommendation Comments on Treatment Session  pt presents with R TKA.  pt making progress this pm.  pt noted needing to relax for a moment before he can use urinal and then get in CPM.  Instructed pt to ring for RN once done with urinal.      Follow Up Recommendations  Home health PT;Supervision - Intermittent     Does the patient have the potential to tolerate intense rehabilitation     Barriers to Discharge        Equipment Recommendations  3 in 1 bedside comode    Recommendations for Other Services    Frequency 7X/week   Plan Discharge plan remains appropriate;Frequency remains appropriate    Precautions / Restrictions Precautions Precautions: Knee Restrictions Weight Bearing Restrictions: Yes RLE Weight Bearing: Weight bearing as tolerated   Pertinent Vitals/Pain Indicates 3/10 in R knee.      Mobility  Bed Mobility Bed Mobility: Sit to Supine Sit to Supine: 4: Min assist Details for Bed Mobility Assistance: A with R LE only Transfers Transfers: Sit to Stand;Stand to Sit Sit to Stand: 4: Min assist;With upper extremity assist;With armrests;From chair/3-in-1 Stand to Sit: 4: Min guard;With upper extremity assist;To bed Details for Transfer Assistance: pt with much better control with stand to sit this pm.  cues for safe technique.   Ambulation/Gait Ambulation/Gait Assistance: 4: Min guard Ambulation Distance (Feet): 140 Feet Assistive device: Rolling walker Ambulation/Gait Assistance Details: cues for upright posture, hip/trunk and Bil LE extension.  cues for positioning in RW.   Gait Pattern: Decreased step length - left;Decreased stance time - right;Trunk flexed Stairs: No Wheelchair Mobility Wheelchair Mobility: No    Exercises     PT Diagnosis:    PT Problem  List:   PT Treatment Interventions:     PT Goals Acute Rehab PT Goals Time For Goal Achievement: 01/19/12 PT Goal: Sit to Stand - Progress: Progressing toward goal PT Goal: Ambulate - Progress: Progressing toward goal  Visit Information  Last PT Received On: 01/13/12 Assistance Needed: +1    Subjective Data  Subjective: I'm feeling a little better.     Cognition  Overall Cognitive Status: Appears within functional limits for tasks assessed/performed Arousal/Alertness: Awake/alert Orientation Level: Appears intact for tasks assessed Behavior During Session: [facility] for tasks performed    Balance  Balance Balance Assessed: No  End of Session PT - End of Session Equipment Utilized During Treatment: Gait belt Activity Tolerance: Patient tolerated treatment well Patient left: in bed;with call bell/phone within reach;with family/visitor present Nurse Communication: Mobility status   GP     Charles Peck, Epes [phone] 01/13/2012, 2:38 PM

## 2012-01-13 NOTE — Progress Notes (Signed)
Physical Therapy Treatment Patient Details Name: Charles Peck MRN: [id number] DOB: [birth year] Today's Date: 01/13/2012 Time: 0825-0904 PT Time Calculation (min): 39 min  PT Assessment / Plan / Recommendation Comments on Treatment Session  pt presents with R TKA.  pt generally with mobility difficulty prior to surgery per pt due to back problems.  pt motivated to improve.      Follow Up Recommendations  Home health PT;Supervision - Intermittent     Does the patient have the potential to tolerate intense rehabilitation     Barriers to Discharge        Equipment Recommendations  3 in 1 bedside comode    Recommendations for Other Services    Frequency 7X/week   Plan Discharge plan remains appropriate;Frequency remains appropriate    Precautions / Restrictions Precautions Precautions: Knee Restrictions Weight Bearing Restrictions: Yes RLE Weight Bearing: Weight bearing as tolerated   Pertinent Vitals/Pain Pain 7/10 in R knee.  Pt using PCA.      Mobility  Bed Mobility Bed Mobility: Supine to Sit;Sitting - Scoot to Edge of Bed Supine to Sit: 4: Min assist;With rails;HOB elevated Sitting - Scoot to Edge of Bed: 4: Min assist Details for Bed Mobility Assistance: A with R LE only.   Transfers Transfers: Sit to Stand;Stand to Sit Sit to Stand: 3: Mod assist;With upper extremity assist;From bed Stand to Sit: 1: +2 Total assist;With upper extremity assist;To chair/3-in-1;With armrests Stand to Sit: Patient Percentage: 60% Details for Transfer Assistance: 2 person A required for stand to sit as pt unable to position R LE prior to sitting without A and needed seconda person to maintain R LE positioning during sit.  Cues for sequencing, safe technique.   Ambulation/Gait Ambulation/Gait Assistance: 4: Min assist Ambulation Distance (Feet): 80 Feet Assistive device: Rolling walker Ambulation/Gait Assistance Details: pt remains in flexed posture with difficulty extending trunk/hips  and Bil knees.  cues for safe use of RW, sequencing, and upright posture.   Gait Pattern: Decreased step length - left;Decreased stance time - right;Trunk flexed Stairs: No Wheelchair Mobility Wheelchair Mobility: No    Exercises Total Joint Exercises Ankle Circles/Pumps: AROM;Both;10 reps Quad Sets: AROM;Both;10 reps Long Arc Quad: AAROM;Right;10 reps Knee Flexion: AAROM;Right;5 reps Goniometric ROM: AAROM ~15- 85   PT Diagnosis:    PT Problem List:   PT Treatment Interventions:     PT Goals Acute Rehab PT Goals Time For Goal Achievement: 01/19/12 PT Goal: Supine/Side to Sit - Progress: Progressing toward goal PT Goal: Sit to Stand - Progress: Progressing toward goal PT Goal: Ambulate - Progress: Progressing toward goal PT Goal: Perform Home Exercise Program - Progress: Progressing toward goal  Visit Information  Last PT Received On: 01/13/12 Assistance Needed: +1    Subjective Data  Subjective: I'm ready to do this.     Cognition  Overall Cognitive Status: Appears within functional limits for tasks assessed/performed Arousal/Alertness: Awake/alert Orientation Level: Appears intact for tasks assessed Behavior During Session: [facility] for tasks performed    Balance  Balance Balance Assessed: No  End of Session PT - End of Session Equipment Utilized During Treatment: Gait belt Activity Tolerance: Patient tolerated treatment well Patient left: in chair;with call bell/phone within reach;with family/visitor present Nurse Communication: Mobility status   GP     Charles Peck, Charles Peck [phone] 01/13/2012, 9:10 AM

## 2012-01-13 NOTE — Anesthesia Postprocedure Evaluation (Signed)
  Anesthesia Post-op Note  Patient: Charles Peck  Procedure(s) Performed: Procedure(s) (LRB) with comments: TOTAL KNEE ARTHROPLASTY (Right)  Patient Location: PACU  Anesthesia Type:GA combined with regional for post-op pain  Level of Consciousness: awake, alert , oriented and patient cooperative  Airway and Oxygen Therapy: Patient Spontanous Breathing and Patient connected to nasal cannula oxygen  Post-op Pain: mild  Post-op Assessment: Post-op Vital signs reviewed, Patient's Cardiovascular Status Stable, Respiratory Function Stable, Patent Airway, No signs of Nausea or vomiting and Pain level controlled  Post-op Vital Signs: Reviewed and stable  Complications: No apparent anesthesia complications

## 2012-01-13 NOTE — Progress Notes (Signed)
CARE MANAGEMENT NOTE 01/13/2012  Patient:  Charles Peck, Charles Peck   Account Number:  [id number]  Date Initiated:  01/13/2012  Documentation initiated by:  Vance Peper  Subjective/Objective Assessment:   41 yr old male s/p right total knee arthroplasty.     Action/Plan:   CM spoke with patient concerning home health and DME needs at discharge. Choice offered. wide 3in1 and CPM to be delivered to patient's home by [employer].   Anticipated DC Date:  01/14/2012   Anticipated DC Plan:  HOME W HOME HEALTH SERVICES      DC Planning Services  CM consult      [facility] Choice  HOME HEALTH   Choice offered to / List presented to:  C-1 Patient   DME arranged  3-N-1  CPM      DME agency  [employer]     HH arranged  HH-2 PT      HH agency  [facility]   Status of service:  Completed, signed off Medicare Important Message given?   (If response is "NO", the following Medicare IM given date fields will be blank) Date Medicare IM given:   Date Additional Medicare IM given:    Discharge Disposition:  HOME W HOME HEALTH SERVICES  Per UR Regulation:    If discussed at Long Length of Stay Meetings, dates discussed:    Comments:

## 2012-01-13 NOTE — Progress Notes (Signed)
UR COMPLETED  

## 2012-01-13 NOTE — Progress Notes (Signed)
Foley d/c at 0645, pt due to void. Endorsed to on coming RN.

## 2012-01-13 NOTE — Progress Notes (Signed)
Patient ID: ELION HOCKER, male   DOB: June 08, 1970, 41 y.o.   MRN: [id number] PATIENT ID: ISHMAEL BERKOVICH  MRN: [id number]  DOB/AGE:  1970-10-14 / 41 y.o.  1 Day Post-Op Procedure(s) (LRB): TOTAL KNEE ARTHROPLASTY (Right)    PROGRESS NOTE Subjective: Patient is alert, oriented, no Nausea, no Vomiting, yes passing gas, no Bowel Movement. Taking PO well. Denies SOB, Chest or Calf Pain. Using Incentive Spirometer, PAS in place. Ambulate WBAT today, CPM 0-30 Patient reports pain as 3 on 0-10 scale  .    Objective: Vital signs in last 24 hours: Filed Vitals:   01/12/12 2200 01/13/12 0010 01/13/12 0412 01/13/12 0514  BP: 125/65   105/51  Pulse: 81   61  Temp: 98.2 F (36.8 C)   98.2 F (36.8 C)  TempSrc:      Resp: 20 21 20 18   SpO2: 95% 98% 98% 99%      Intake/Output from previous day: I/O last 3 completed shifts: In: 1930 [P.O.:480; I.V.:1450] Out: 1725 [Urine:1250; Drains:425; Blood:50]   Intake/Output this shift: Total I/O In: -  Out: 800 [Urine:550; Drains:250]   LABORATORY DATA: No results found for this basename: WBC:2,HGB:2,HCT:2,PLT:2,NA:2,K:2,CL:2,CO2:2,BUN:2,CREATININE:2,GLUCOSE:2,GLUCAP:3,PT:2,INR:2,CALCIUM,2 in the last 72 hours  Examination: Neurologically intact ABD soft Neurovascular intact Sensation intact distally Intact pulses distally Dorsiflexion/Plantar flexion intact Incision: no drainage No cellulitis present Compartment soft} Blood and plasma separated in drain indicating minimal recent drainage, drain pulled without difficulty.  Assessment:   1 Day Post-Op Procedure(s) (LRB): TOTAL KNEE ARTHROPLASTY (Right) ADDITIONAL DIAGNOSIS:    Plan: PT/OT WBAT, CPM 5/hrs day until ROM 0-90 degrees, then D/C CPM DVT Prophylaxis:  SCDx72hrs, ASA 325 mg BID x 2 weeks DISCHARGE PLAN: Home DISCHARGE NEEDS: HHPT, HHRN, CPM, Walker and 3-in-1 comode seat     Emiliana Blaize J 01/13/2012, 6:24 AM

## 2012-01-14 LAB — CBC
HCT: 28.6 % — ABNORMAL LOW (ref 39.0–52.0)
Hemoglobin: 9.2 g/dL — ABNORMAL LOW (ref 13.0–17.0)
MCH: 26.6 pg (ref 26.0–34.0)
MCHC: 32.2 g/dL (ref 30.0–36.0)
MCV: 82.7 fL (ref 78.0–100.0)
RDW: 13.9 % (ref 11.5–15.5)

## 2012-01-14 NOTE — Progress Notes (Signed)
Physical Therapy Treatment Patient Details Name: Charles Peck MRN: [id number] DOB: [birth year] Today's Date: 01/14/2012 Time: 2841-3244 PT Time Calculation (min): 14 min  PT Assessment / Plan / Recommendation Comments on Treatment Session  POD # 2 R TKR pt plans to D/C to home tommorrow but needs to practice going up/down 10 or 12 steps with B rails that he states he can reach both.    Follow Up Recommendations  Home health PT     Does the patient have the potential to tolerate intense rehabilitation     Barriers to Discharge        Equipment Recommendations  3 in 1 bedside comode    Recommendations for Other Services    Frequency 7X/week   Plan Discharge plan remains appropriate    Precautions / Restrictions Precautions Precautions: Knee Restrictions Weight Bearing Restrictions: No RLE Weight Bearing: Weight bearing as tolerated    Pertinent Vitals/Pain C/o 4/10 C/o max fatigue    Mobility  Bed Mobility Bed Mobility: Supine to Sit;Sit to Supine Supine to Sit: 4: Min assist Sitting - Scoot to Edge of Bed: 4: Min assist Sit to Supine: 4: Min assist Details for Bed Mobility Assistance: increased time and assist with R LE  Transfers Transfers: Sit to Stand;Stand to Sit Sit to Stand: 3: Mod assist;From elevated surface;From bed Stand to Sit: 4: Min assist Details for Transfer Assistance: <25% VC's on safety with turns and backward gait to target.  Ambulation/Gait Ambulation/Gait Assistance: 4: Min guard Ambulation Distance (Feet): 110 Feet Assistive device: Rolling walker Ambulation/Gait Assistance Details: <25% VC's on safety with turns and backward gait sequencing to the bed. Gait Pattern: Step-to pattern;Decreased stance time - right Gait velocity: decreased        PT Goals                         progressing    Visit Information  Last PT Received On: 01/14/12 Assistance Needed: +1    Subjective Data  Subjective: I'm going home tommorrow Patient  Stated Goal: home   Cognition    good   Balance   fair  End of Session PT - End of Session Equipment Utilized During Treatment: Gait belt Activity Tolerance: Patient limited by fatigue Patient left: in bed;with call bell/phone within reach;Other (comment) (ICE to R knee and placed in CPM) CPM Right Knee CPM Right Knee: On Right Knee Flexion (Degrees): 60  Right Knee Extension (Degrees): 0  Additional Comments: on at 14;45   Felecia Shelling  PTA [facility]  Acute  Rehab Pager     [phone]

## 2012-01-14 NOTE — Progress Notes (Signed)
01/14/12 1228 01/14/12 1229 01/14/12 1233  PT Visit Information  Last PT Received On 01/14/12 --  --   Assistance Needed +1 --  --   PT Time Calculation  PT Start Time 1040 --  --   PT Stop Time 1108 --  --   PT Time Calculation (min) 28 min --  --   Subjective Data  Subjective --  the pain meds helped --   Patient Stated Goal --  home --   Bed Mobility  Bed Mobility --  Sit to Supine --   Sit to Supine --  4: Min assist --   Details for Bed Mobility Assistance --  min assist to support R LE up onto bed --   Transfers  Transfers --  Sit to Stand;Stand to Sit --   Sit to Stand --  2: Max assist;From chair/3-in-1 (low chair) --   Stand to Sit --  4: Min assist;To bed --   Details for Transfer Assistance --  <25% VC's on safety with turns and backward gait to target. --   Ambulation/Gait  Ambulation/Gait Assistance --  4: Min guard --   Ambulation Distance (Feet) --  85 Feet --   Assistive device --  Rolling walker --   Ambulation/Gait Assistance Details --  increased pain, but better than this am's attempt.  <25% VC's on sequencing and upright posture. --   Gait Pattern --  Step-to pattern;Decreased stance time - right;Trunk flexed --   Gait velocity --  decreased --   Total Joint Exercises  Ankle Circles/Pumps --  --  AROM;Both;10 reps;Supine  Quad Sets --  --  AROM;Both;10 reps;Supine  Gluteal Sets --  --  AROM;Both;10 reps;Supine  Towel Squeeze --  --  AROM;Both;10 reps;Supine  Heel Slides --  --  AAROM;Right;10 reps;Supine (using bed sheet)  Hip ABduction/ADduction --  --  AAROM;Right;10 reps;Supine  Straight Leg Raises --  --  AAROM;Right;10 reps;Supine  PT - End of Session  Equipment Utilized During Treatment --  --  --   Activity Tolerance --  --  --   Patient left --  --  --   PT - Assessment/Plan  Comments on Treatment Session --  --  --   PT Plan --  --  --   PT Frequency --  --  --   Follow Up Recommendations --  --  --   Equipment Recommended --  --  --      01/14/12 1235  PT Visit Information  Last PT Received On --   Assistance Needed --   PT Time Calculation  PT Start Time --   PT Stop Time --   PT Time Calculation (min) --   Subjective Data  Subjective --   Patient Stated Goal --   Bed Mobility  Bed Mobility --   Sit to Supine --   Details for Bed Mobility Assistance --   Transfers  Transfers --   Sit to Stand --   Stand to Sit --   Details for Transfer Assistance --   Ambulation/Gait  Ambulation/Gait Assistance --   Ambulation Distance (Feet) --   Assistive device --   Ambulation/Gait Assistance Details --   Gait Pattern --   Gait velocity --   Total Joint Exercises  Ankle Circles/Pumps --   Quad Sets --   Gluteal Sets --   Towel Squeeze --   Heel Slides --   Hip ABduction/ADduction --   Straight Leg Raises --  PT - End of Session  Equipment Utilized During Treatment Gait belt  Activity Tolerance Patient tolerated treatment well  Patient left in bed;with call bell/phone within reach (ICE to R knee)  PT - Assessment/Plan  Comments on Treatment Session Pt performed much better.  Has difficulty with sit to stand 2nd pt's height.  Performed all supine TKR TE's and instructed on use of ICE.  PT Plan Discharge plan remains appropriate  PT Frequency 7X/week  Follow Up Recommendations Home health PT  Equipment Recommended 3 in 1 bedside comode   Felecia Shelling  PTA [facility]  Acute  Rehab Pager     [phone]

## 2012-01-14 NOTE — Progress Notes (Deleted)
Charles Peck  PTA [facility]  Acute  Rehab Pager     [phone]  

## 2012-01-14 NOTE — Progress Notes (Signed)
Physical Therapy Treatment Patient Details Name: Charles Peck MRN: [id number] DOB: [birth year] Today's Date: 01/14/2012 Time: 4098-1191 PT Time Calculation (min): 27 min  PT Assessment / Plan / Recommendation Comments on Treatment Session  POD # 2 R TKR am session.  Increased c/o pain and poor sleep last night pt performed poorly.  Only able to assist him OOB to recliner and call RN for oral pain meds as PCA gave no relief.    Follow Up Recommendations  Home health PT     Does the patient have the potential to tolerate intense rehabilitation     Barriers to Discharge        Equipment Recommendations  3 in 1 bedside comode    Recommendations for Other Services    Frequency 7X/week   Plan Discharge plan remains appropriate    Precautions / Restrictions Precautions Precautions: Knee Restrictions Weight Bearing Restrictions: No RLE Weight Bearing: Weight bearing as tolerated    Pertinent Vitals/Pain C/o 9/10 R knee pain RN called to room ICE applied    Mobility  Bed Mobility Bed Mobility: Supine to Sit Supine to Sit: 3: Mod assist Sitting - Scoot to Edge of Bed: 3: Mod assist Details for Bed Mobility Assistance: increased time and Mod assist to support R LE  Transfers Transfers: Sit to Stand;Stand to Sit Sit to Stand: 2: Max assist;From bed Stand to Sit: 2: Max assist;To chair/3-in-1 Details for Transfer Assistance: increased time and increased assist 2nd increased c/o pain.  PCA x 1 with little relief so called RN for oral meds.  Ambulation/Gait Ambulation/Gait Assistance: 3: Mod assist Ambulation Distance (Feet): 3 Feet Assistive device: Rolling walker Ambulation/Gait Assistance Details: unable to tolerate amb due to increased c/o pain.  So only able to get from bed to recliner with much effort.  RN called for oral meds.  ICE applied to R knee. Gait Pattern: Step-to pattern;Decreased stance time - right Gait velocity: decreased    Exercises          PT  Goals                           progressing    Visit Information  Last PT Received On: 01/14/12 Assistance Needed: +1    Subjective Data  Subjective: I'm hurting more   Cognition    good   Balance   poor  End of Session PT - End of Session Equipment Utilized During Treatment: Gait belt Activity Tolerance: Patient limited by pain Patient left: in chair;with call bell/phone within reach;with family/visitor present (ICE on R knee)   Felecia Shelling  PTA [facility]  Acute  Rehab Pager     [phone]

## 2012-01-14 NOTE — Progress Notes (Signed)
PATIENT ID: CHRISTIAN BORGERDING  MRN: [id number]  DOB/AGE:  04/05/1970 / 41 y.o.  2 Days Post-Op Procedure(s) (LRB): TOTAL KNEE ARTHROPLASTY (Right)    PROGRESS NOTE Subjective: Patient is alert, oriented, no Nausea, no Vomiting, yes passing gas, no Bowel Movement. Taking PO well. Denies SOB, Chest or Calf Pain. Using Incentive Spirometer, PAS in place. Ambulating well with PT. Patient reports pain as moderate  .    Objective: Vital signs in last 24 hours: Filed Vitals:   01/14/12 0309 01/14/12 0400 01/14/12 0554 01/14/12 0749  BP:   109/60   Pulse:   71   Temp:   98.9 F (37.2 C)   TempSrc:   Oral   Resp: 18 20 16 18   SpO2: 96% 96% 100% 98%      Intake/Output from previous day: I/O last 3 completed shifts: In: 360 [P.O.:360] Out: 1150 [Urine:900; Drains:250]   Intake/Output this shift:     LABORATORY DATA:  Basename 01/14/12 0540 01/13/12 0720  WBC 7.9 11.8*  HGB 9.2* 10.7*  HCT 28.6* 33.8*  PLT 136* 160  NA -- 140  K -- 4.2  CL -- 103  CO2 -- 29  BUN -- 10  CREATININE -- 0.79  GLUCOSE -- 127*  GLUCAP -- --  INR -- --  CALCIUM -- 8.8    Examination: Neurologically intact ABD soft Neurovascular intact Sensation intact distally Intact pulses distally Dorsiflexion/Plantar flexion intact Incision: dressing C/D/I}  Assessment:   2 Days Post-Op Procedure(s) (LRB): TOTAL KNEE ARTHROPLASTY (Right) ADDITIONAL DIAGNOSIS:  none  Plan: PT/OT WBAT, CPM 5/hrs day until ROM 0-90 degrees, then D/C CPM D/C PCA, change dressing.  DVT Prophylaxis:  SCDx72hrs, ASA 325 mg BID x 2 weeks DISCHARGE PLAN: Home Thursday DISCHARGE NEEDS: HHPT, HHRN, CPM, Walker and 3-in-1 comode seat     Kidada Ging M. 01/14/2012, 7:55 AM

## 2012-01-15 ENCOUNTER — Encounter (HOSPITAL_COMMUNITY): Payer: Self-pay | Admitting: Orthopedic Surgery

## 2012-01-15 DIAGNOSIS — M1711 Unilateral primary osteoarthritis, right knee: Secondary | ICD-10-CM | POA: Diagnosis present

## 2012-01-15 LAB — CBC
MCH: 26.6 pg (ref 26.0–34.0)
MCHC: 32.3 g/dL (ref 30.0–36.0)
MCV: 82.2 fL (ref 78.0–100.0)
Platelets: 152 10*3/uL (ref 150–400)
RDW: 14 % (ref 11.5–15.5)

## 2012-01-15 MED ORDER — OXYCODONE-ACETAMINOPHEN 5-325 MG PO TABS: 1.0000 | ORAL_TABLET | ORAL | Status: DC | PRN

## 2012-01-15 MED ORDER — ASPIRIN 325 MG PO TBEC: 325.0000 mg | DELAYED_RELEASE_TABLET | Freq: Two times a day (BID) | ORAL | Status: DC

## 2012-01-15 NOTE — Discharge Summary (Signed)
Patient ID: Charles Peck MRN: [id number] DOB/AGE: 05/29/1970 41 y.o.  Admit date: 01/12/2012 Discharge date: 01/15/2012  Admission Diagnoses:  Principal Problem:  *Osteoarthritis of right knee   Discharge Diagnoses:  Same  Past Medical History  Diagnosis Date  . Degenerative joint disease     Surgeries: Procedure(s): TOTAL KNEE ARTHROPLASTY on 01/12/2012   Consultants:    Discharged Condition: Improved  Hospital Course: Charles Peck is an 41 y.o. male who was admitted 01/12/2012 for operative treatment ofOsteoarthritis of right knee. Patient has severe unremitting pain that affects sleep, daily activities, and work/hobbies. After pre-op clearance the patient was taken to the operating room on 01/12/2012 and underwent  Procedure(s): TOTAL KNEE ARTHROPLASTY.    Patient was given perioperative antibiotics: Anti-infectives     Start     Dose/Rate Route Frequency Ordered Stop   01/12/12 0855   cefUROXime (ZINACEF) injection  Status:  Discontinued          As needed 01/12/12 0856 01/12/12 1126   01/11/12 1455   ceFAZolin (ANCEF) 3 g in dextrose 5 % 50 mL IVPB        3 g 160 mL/hr over 30 Minutes Intravenous 60 min pre-op 01/11/12 1455 01/12/12 0926           Patient was given sequential compression devices, early ambulation, and chemoprophylaxis to prevent DVT.  Patient benefited maximally from hospital stay and there were no complications.    Recent vital signs: Patient Vitals for the past 24 hrs:  BP Temp Temp src Pulse Resp SpO2 Height  01/15/12 0626 145/69 mmHg 99.1 F (37.3 C) Oral 66  18  98 % -  01/15/12 0400 - - - - 18  - -  01/15/12 0000 - 99.7 F (37.6 C) - - 20  98 % -  01/14/12 2104 142/77 mmHg 100.6 F (38.1 C) Oral 81  20  98 % -  01/14/12 2000 - - - - 20  - 6\' 4"  (1.93 m)  01/14/12 1600 - - - - 18  - -  01/14/12 1400 130/58 mmHg 99.3 F (37.4 C) - 81  18  98 % -  01/14/12 1200 - - - - 18  - -     Recent laboratory studies:  Basename  01/15/12 0630 01/14/12 0540 01/13/12 0720  WBC 8.8 7.9 --  HGB 9.7* 9.2* --  HCT 30.0* 28.6* --  PLT 152 136* --  NA -- -- 140  K -- -- 4.2  CL -- -- 103  CO2 -- -- 29  BUN -- -- 10  CREATININE -- -- 0.79  GLUCOSE -- -- 127*  INR -- -- --  CALCIUM -- -- 8.8     Discharge Medications:     Medication List     As of 01/15/2012  9:25 AM    TAKE these medications         aspirin 325 MG EC tablet   Take 1 tablet (325 mg total) by mouth 2 (two) times daily.      baclofen 20 MG tablet   Commonly known as: LIORESAL   Take 20 mg by mouth 3 (three) times daily.      oxyCODONE 15 MG immediate release tablet   Commonly known as: ROXICODONE   Take 15 mg by mouth every 4 (four) hours as needed. For pain      oxyCODONE-acetaminophen 5-325 MG per tablet   Commonly known as: PERCOCET/ROXICET   Take 1-2 tablets by mouth every 4 (  four) hours as needed for pain.        Diagnostic Studies: Dg Chest 2 View  01/05/2012  *RADIOLOGY REPORT*  Clinical Data: Preop right knee replacement  CHEST - 2 VIEW  Comparison: CT chest dated 06/19/2009  Findings: Lungs are essentially clear.  No pleural effusion or pneumothorax.  Cardiomediastinal silhouette is within normal limits.  Degenerative changes of the visualized thoracolumbar spine.  Cervical spine fixation hardware, incompletely visualized.  IMPRESSION: No evidence of acute cardiopulmonary disease.   Original Report Authenticated By: Charline Bills, M.D.     Disposition:       Discharge Orders    Future Orders Please Complete By Expires   Increase activity slowly      Walker       May shower / Bathe      Driving Restrictions      Comments:   No driving for 2 weeks.   Change dressing (specify)      Comments:   Dressing change as needed.   Call MD for:  temperature >100.4      Call MD for:  severe uncontrolled pain      Call MD for:  redness, tenderness, or signs of infection (pain, swelling, redness, odor or green/yellow  discharge around incision site)      Discharge instructions      Comments:   F/U with Dr. Turner Daniels as scheduled (POD #14)         Signed: Hazle Nordmann. 01/15/2012, 9:25 AM

## 2012-01-15 NOTE — Progress Notes (Signed)
Physical Therapy Treatment Patient Details Name: Charles Peck MRN: [id number] DOB: 08-24-1970 Today's Date: 01/15/2012 Time: 4098-1191 PT Time Calculation (min): 26 min  PT Assessment / Plan / Recommendation Comments on Treatment Session  Pt able to perform stair negotiation however needs to practice with family prior to d/c home.  KI ordered to prevent right knee buckling for stairs only.    Follow Up Recommendations  Home health PT     Does the patient have the potential to tolerate intense rehabilitation     Barriers to Discharge  None      Equipment Recommendations  3 in 1 bedside comode    Recommendations for Other Services    Frequency 7X/week   Plan Discharge plan remains appropriate    Precautions / Restrictions Restrictions Weight Bearing Restrictions: No RLE Weight Bearing: Weight bearing as tolerated   Pertinent Vitals/Pain 7/10 right knee pain    Mobility  Bed Mobility Bed Mobility: Not assessed Transfers Transfers: Sit to Stand;Stand to Sit Sit to Stand: 4: Min assist;From chair/3-in-1 Stand to Sit: 4: Min assist;To chair/3-in-1 Details for Transfer Assistance: (A) to initiate transfer with max cues for hand placement Ambulation/Gait Ambulation/Gait Assistance: 4: Min guard Ambulation Distance (Feet): 60 Feet Assistive device: Rolling walker Ambulation/Gait Assistance Details: Minguard for safety with cues for increase weight through right LE.  Pt having difficulty clearing left LE with swing phase due to decrease SLS on right LE. Gait Pattern: Step-to pattern;Decreased stance time - right Gait velocity: decreased Stairs: Yes Stairs Assistance: 3: Mod assist Stairs Assistance Details (indicate cue type and reason): (A) to maintain balance and prevent right knee buckling.  VCs for step sequence and proper hand placement.  Pt hesitant with stairs.  Called MD to order knee immobilizer for stairs only for right knee buckling. Stair Management Technique:  Two rails;Backwards Number of Stairs: 3  (x2) Wheelchair Mobility Wheelchair Mobility: No    Exercises     PT Diagnosis:    PT Problem List:   PT Treatment Interventions:     PT Goals Acute Rehab PT Goals PT Goal Formulation: With patient Time For Goal Achievement: 01/19/12 Potential to Achieve Goals: Good Pt will go Supine/Side to Sit: Independently;with HOB 0 degrees PT Goal: Supine/Side to Sit - Progress: Progressing toward goal Pt will go Sit to Stand: with modified independence;with upper extremity assist PT Goal: Sit to Stand - Progress: Progressing toward goal Pt will Ambulate: 51 - 150 feet;with rolling walker;with supervision PT Goal: Ambulate - Progress: Progressing toward goal Pt will Go Up / Down Stairs: Flight;with supervision;with rail(s) PT Goal: Up/Down Stairs - Progress: Progressing toward goal  Visit Information  Last PT Received On: 01/15/12 Assistance Needed: +1    Subjective Data  Subjective: "I'm still hurting but I'm willing to do the stairs." Patient Stated Goal: home   Cognition  Overall Cognitive Status: Appears within functional limits for tasks assessed/performed Arousal/Alertness: Awake/alert Orientation Level: Appears intact for tasks assessed Behavior During Session: [facility] for tasks performed    Balance     End of Session PT - End of Session Equipment Utilized During Treatment: Gait belt Activity Tolerance: Patient tolerated treatment well Patient left: in chair;with call bell/phone within reach;with family/visitor present Nurse Communication: Mobility status   GP     Miroslava Santellan 01/15/2012, 9:13 AM Jake Shark, PT DPT [phone]

## 2012-01-15 NOTE — Progress Notes (Signed)
Orthopedic Tech Progress Note Patient Details:  Charles Peck 01-03-1971 [id number] Knee immobilizer applied to Right leg. Application instructions explained. Tolerated well. Ortho Devices Type of Ortho Device: Knee Immobilizer Ortho Device/Splint Location: Right LE Ortho Device/Splint Interventions: Application   [name] 01/15/2012, 9:35 AM

## 2012-01-15 NOTE — Progress Notes (Signed)
PATIENT ID: HAEDYN ANCRUM  MRN: [id number]  DOB/AGE:  [birth year] / 41 y.o.  3 Days Post-Op Procedure(s) (LRB): TOTAL KNEE ARTHROPLASTY (Right)    PROGRESS NOTE Subjective: Patient is alert, oriented, no Nausea, no Vomiting, yes passing gas, no Bowel Movement. Taking PO well. Denies SOB, Chest or Calf Pain. Using Incentive Spirometer, PAS in place. Ambulating well with PT, did stairs with immobilizer. Patient reports pain as moderate  .    Objective: Vital signs in last 24 hours: Filed Vitals:   01/14/12 2104 01/15/12 0000 01/15/12 0400 01/15/12 0626  BP: 142/77   145/69  Pulse: 81   66  Temp: 100.6 F (38.1 C) 99.7 F (37.6 C)  99.1 F (37.3 C)  TempSrc: Oral   Oral  Resp: 20 20 18 18   Height:      SpO2: 98% 98%  98%      Intake/Output from previous day: I/O last 3 completed shifts: In: 240 [P.O.:240] Out: 750 [Urine:750]   Intake/Output this shift:     LABORATORY DATA:  Basename 01/15/12 0630 01/14/12 0540 01/13/12 0720  WBC 8.8 7.9 --  HGB 9.7* 9.2* --  HCT 30.0* 28.6* --  PLT 152 136* --  NA -- -- 140  K -- -- 4.2  CL -- -- 103  CO2 -- -- 29  BUN -- -- 10  CREATININE -- -- 0.79  GLUCOSE -- -- 127*  GLUCAP -- -- --  INR -- -- --  CALCIUM -- -- 8.8    Examination: Neurologically intact ABD soft Neurovascular intact Sensation intact distally Intact pulses distally Dorsiflexion/Plantar flexion intact Incision: dressing C/D/I}  Assessment:   3 Days Post-Op Procedure(s) (LRB): TOTAL KNEE ARTHROPLASTY (Right) ADDITIONAL DIAGNOSIS:  none  Plan: PT/OT WBAT, CPM 5/hrs day until ROM 0-90 degrees, then D/C CPM DVT Prophylaxis:  SCDx72hrs, ASA 325 mg BID x 2 weeks DISCHARGE PLAN: Home today Patient would like to shower before d/c. DISCHARGE NEEDS: HHPT, HHRN, CPM, Walker and 3-in-1 comode seat     Kimberlynn Lumbra M. 01/15/2012, 9:22 AM

## 2012-01-15 NOTE — Progress Notes (Signed)
Physical Therapy Treatment Patient Details Name: Charles Peck MRN: [id number] DOB: 05-07-1970 Today's Date: 01/15/2012 Time: 4098-1191 PT Time Calculation (min): 20 min  PT Assessment / Plan / Recommendation Comments on Treatment Session  Pt improved on stair negotiation with increase confidence with use of KI.  Educated pt on using KI for stairs only.    Follow Up Recommendations  Home health PT     Does the patient have the potential to tolerate intense rehabilitation     Barriers to Discharge        Equipment Recommendations  3 in 1 bedside comode    Recommendations for Other Services    Frequency 7X/week   Plan Discharge plan remains appropriate    Precautions / Restrictions Restrictions Weight Bearing Restrictions: No RLE Weight Bearing: Weight bearing as tolerated   Pertinent Vitals/Pain 3/10 right knee pain    Mobility  Bed Mobility Bed Mobility: Not assessed Transfers Transfers: Sit to Stand;Stand to Sit Sit to Stand: 4: Min guard;From chair/3-in-1 Stand to Sit: 4: Min guard;To chair/3-in-1 Details for Transfer Assistance: Minguard for safety with cues for hand placement Ambulation/Gait Ambulation/Gait Assistance: 4: Min guard Ambulation Distance (Feet): 60 Feet Assistive device: Rolling walker Ambulation/Gait Assistance Details: Minguard for safety with cues for proper step sequence and cues for upright posture. Gait Pattern: Step-to pattern;Decreased stance time - right Gait velocity: decreased Stairs: Yes Stairs Assistance: 4: Min guard Stairs Assistance Details (indicate cue type and reason): minguard for safety with cues for step sequence; KI applied with stairs and improved overal sequence Stair Management Technique: Two rails;Backwards Number of Stairs: 3  (x3) Wheelchair Mobility Wheelchair Mobility: No    Exercises     PT Diagnosis:    PT Problem List:   PT Treatment Interventions:     PT Goals Acute Rehab PT Goals PT Goal Formulation:  With patient Time For Goal Achievement: 01/19/12 Potential to Achieve Goals: Good Pt will go Supine/Side to Sit: Independently;with HOB 0 degrees PT Goal: Supine/Side to Sit - Progress: Not met Pt will go Sit to Stand: with modified independence;with upper extremity assist PT Goal: Sit to Stand - Progress: Progressing toward goal Pt will Ambulate: 51 - 150 feet;with rolling walker;with supervision PT Goal: Ambulate - Progress: Progressing toward goal Pt will Go Up / Down Stairs: Flight;with supervision;with rail(s) PT Goal: Up/Down Stairs - Progress: Progressing toward goal  Visit Information  Last PT Received On: 01/15/12 Assistance Needed: +1    Subjective Data  Subjective: "I felt better on the stairs this afternoon." Patient Stated Goal: home   Cognition  Overall Cognitive Status: Appears within functional limits for tasks assessed/performed Arousal/Alertness: Awake/alert Orientation Level: Appears intact for tasks assessed Behavior During Session: [facility] for tasks performed    Balance     End of Session PT - End of Session Equipment Utilized During Treatment: Gait belt Activity Tolerance: Patient tolerated treatment well Patient left: in chair;with call bell/phone within reach;with family/visitor present Nurse Communication: Mobility status   GP     Anaily Ashbaugh 01/15/2012, 3:45 PM [employer], PT DPT [phone]

## 2012-01-15 NOTE — Progress Notes (Signed)
Patient discharged in stable condition via wheelchair. Discharge instructions and prescriptions were given and explained 

## 2012-05-01 ENCOUNTER — Other Ambulatory Visit: Payer: Self-pay

## 2012-05-24 ENCOUNTER — Other Ambulatory Visit (HOSPITAL_COMMUNITY): Payer: Self-pay | Admitting: Urology

## 2012-05-24 DIAGNOSIS — N433 Hydrocele, unspecified: Secondary | ICD-10-CM

## 2012-05-26 ENCOUNTER — Ambulatory Visit (HOSPITAL_COMMUNITY): Payer: BC Managed Care – PPO

## 2012-05-27 ENCOUNTER — Ambulatory Visit (HOSPITAL_COMMUNITY)
Admission: RE | Admit: 2012-05-27 | Discharge: 2012-05-27 | Disposition: A | Discharge status: Home / Self Care | Payer: BC Managed Care – PPO | Source: Ambulatory Visit | Attending: Urology | Admitting: Urology

## 2012-05-27 DIAGNOSIS — N508 Other specified disorders of male genital organs: Secondary | ICD-10-CM | POA: Insufficient documentation

## 2012-05-27 DIAGNOSIS — N442 Benign cyst of testis: Secondary | ICD-10-CM

## 2012-05-27 DIAGNOSIS — N433 Hydrocele, unspecified: Secondary | ICD-10-CM

## 2013-01-20 ENCOUNTER — Other Ambulatory Visit: Payer: Self-pay

## 2013-02-22 ENCOUNTER — Other Ambulatory Visit: Payer: Self-pay | Admitting: Urology

## 2013-03-23 ENCOUNTER — Encounter (HOSPITAL_COMMUNITY): Payer: Self-pay | Admitting: Pharmacy Technician

## 2013-03-29 ENCOUNTER — Encounter (HOSPITAL_COMMUNITY)
Admission: RE | Admit: 2013-03-29 | Discharge: 2013-03-29 | Disposition: A | Discharge status: Home / Self Care | Payer: 59 | Source: Ambulatory Visit | Attending: Urology | Admitting: Urology

## 2013-03-29 ENCOUNTER — Encounter (HOSPITAL_COMMUNITY): Payer: Self-pay

## 2013-03-29 DIAGNOSIS — N5089 Other specified disorders of the male genital organs: Secondary | ICD-10-CM

## 2013-03-29 HISTORY — DX: Other specified disorders of the male genital organs: N50.89

## 2013-03-29 LAB — CBC
HEMATOCRIT: 37.5 % — AB (ref 39.0–52.0)
HEMOGLOBIN: 12.2 g/dL — AB (ref 13.0–17.0)
MCH: 26 pg (ref 26.0–34.0)
MCHC: 32.5 g/dL (ref 30.0–36.0)
MCV: 79.8 fL (ref 78.0–100.0)
Platelets: 190 10*3/uL (ref 150–400)
RBC: 4.7 MIL/uL (ref 4.22–5.81)
RDW: 14.2 % (ref 11.5–15.5)
WBC: 6.3 10*3/uL (ref 4.0–10.5)

## 2013-03-29 LAB — BASIC METABOLIC PANEL
BUN: 12 mg/dL (ref 6–23)
CHLORIDE: 102 meq/L (ref 96–112)
CO2: 28 mEq/L (ref 19–32)
Calcium: 9.4 mg/dL (ref 8.4–10.5)
Creatinine, Ser: 0.85 mg/dL (ref 0.50–1.35)
GLUCOSE: 70 mg/dL (ref 70–99)
Potassium: 3.9 mEq/L (ref 3.7–5.3)
Sodium: 143 mEq/L (ref 137–147)

## 2013-03-29 NOTE — Patient Instructions (Addendum)
[name]  03/29/2013   Your procedure is scheduled on:   04-01-2013  Report to [facility] at    0530    AM.  Call this number if you have problems the morning of surgery: [phone]  Or Presurgical Testing [phone](Charles Peck) For Living Will and/or Health Care Power Attorney Forms: please provide copy for your medical record,may bring AM of surgery(Forms should be already notarized -we do not provide this service).  Remember: Follow any bowel prep instructions per MD office.    Do not eat food:After Midnight.  .  Take these medicines the morning of surgery with A SIP OF WATER: Oxycodone   Do not wear jewelry, make-up or nail polish.  Do not wear lotions, powders, or perfumes. You may wear deodorant.  Do not shave 12 hours prior to first CHG shower(legs and under arms).(face and neck okay.)  Do not bring valuables to the hospital.(Hospital is not responsible for lost valuables).  Contacts, dentures or removable bridgework, body piercing, hair pins may not be worn into surgery.  Leave suitcase in the car. After surgery it may be brought to your room.  For patients admitted to the hospital, checkout time is 11:00 AM the day of discharge.(Restricted visitors-Persons, age 34 or younger - may not visit at this time.)    Patients discharged the day of surgery will not be allowed to drive home. Must have responsible person with you x 24 hours once discharged.  Name and phone number of your driver: Charles Peck [phone]  cell  Special Instructions: CHG(Chlorhedine 4%-"Hibiclens","Betasept","Aplicare") Shower Use Special Wash: see special instructions.(avoid face and genitals)     Failure to follow these instructions may result in Cancellation of your surgery.   Patient signature_______________________________________________________

## 2013-03-29 NOTE — H&P (Signed)
H&P    History of Present Illness: the patient presents today for right inguinal exploration and partial orchiectomy due to a concerning mass in the right testicle.  His tumor markers in the negative.  Serial scrotal ultrasounds have shown an 1 cm possible solid mass in the right testicle with possible blood flow.  He also has microscopic hematuria and had a negative CT hematuria protocol.  He needs cystoscopy to complete the workup.  Today, the patient as well without cough or fever.  He's been voiding with a good stream and has no dysuria or gross hematuria.  Past Medical History  Diagnosis Date  . Degenerative joint disease   . Testicle swelling 03-29-13    exploratory surgery planned of right testicle   Past Surgical History  Procedure Laterality Date  . Cervical fusion  2005  . Lumbar disc surgery  2009  . Hip arthroplasty  2011    bilateral  . Bunionectomy  2012    also foot surgery to both feet  . Total knee arthroplasty  01/12/2012    right knee  . Total knee arthroplasty  01/12/2012    Procedure: TOTAL KNEE ARTHROPLASTY;  Surgeon: Nestor Lewandowsky, MD;  Location: [facility] OR;  Service: Orthopedics;  Laterality: Right;    Home Medications:  No prescriptions prior to admission   Allergies: No Known Allergies  No family history on file. Social History:  reports that he quit smoking about 17 years ago. He has never used smokeless tobacco. He reports that he does not drink alcohol or use illicit drugs.  ROS: A complete review of systems was performed.  All systems are negative except for pertinent findings as noted. ROS   Physical Exam:  Vital signs in last 24 hours: Temp:  [98.5 F (36.9 C)] 98.5 F (36.9 C) (01/13 1007) Pulse Rate:  [72] 72 (01/13 1007) Resp:  [18] 18 (01/13 1007) BP: (150)/(87) 150/87 mmHg (01/13 1007) SpO2:  [98 %] 98 % (01/13 1007) Weight:  [142.203 kg (313 lb 8 oz)] 142.203 kg (313 lb 8 oz) (01/13 1007) General:  Alert and oriented, No acute  distress HEENT: Normocephalic, atraumatic Neck: No JVD or lymphadenopathy Cardiovascular: Regular rate and rhythm Lungs: Regular rate and effort Abdomen: Soft, nontender, nondistended, no abdominal masses Back: No CVA tenderness Extremities: No edema Neurologic: Grossly intact  Laboratory Data:  Results for orders placed during the hospital encounter of 03/29/13 (from the past 24 hour(s))  CBC     Status: Abnormal   Collection Time    03/29/13 10:25 AM      Result Value Range   WBC 6.3  4.0 - 10.5 K/uL   RBC 4.70  4.22 - 5.81 MIL/uL   Hemoglobin 12.2 (*) 13.0 - 17.0 g/dL   HCT 86.7 (*) 61.9 - 50.9 %   MCV 79.8  78.0 - 100.0 fL   MCH 26.0  26.0 - 34.0 pg   MCHC 32.5  30.0 - 36.0 g/dL   RDW 32.6  71.2 - 45.8 %   Platelets 190  150 - 400 K/uL  BASIC METABOLIC PANEL     Status: None   Collection Time    03/29/13 10:25 AM      Result Value Range   Sodium 143  137 - 147 mEq/L   Potassium 3.9  3.7 - 5.3 mEq/L   Chloride 102  96 - 112 mEq/L   CO2 28  19 - 32 mEq/L   Glucose, Bld 70  70 - 99 mg/dL  BUN 12  6 - 23 mg/dL   Creatinine, Ser 4.16  0.50 - 1.35 mg/dL   Calcium 9.4  8.4 - 60.6 mg/dL   GFR calc non Af Amer >90  >90 mL/min   GFR calc Af Amer >90  >90 mL/min   No results found for this or any previous visit (from the past 240 hour(s)). Creatinine:  Recent Labs  03/29/13 1025  CREATININE 0.85    Impression/Assessment/Plan: I discussed with the patient the nature, potential benefits, risks and alternatives to cystoscopy, right inguinal exploration with partial orchiectomy possible radical orchiectomy, including side effects of the proposed treatment, the likelihood of the patient achieving the goals of the procedure, and any potential problems that might occur during the procedure or recuperation. All questions answered. Patient elects to proceed. He states he does not desire any more children and already has low T, therefore he elects to proceed with radical orchiectomy  if we cannot find the mass. We discussed the mass might be benign or malignant and it would be worthwhile to have some pathology as there is a smaller area but similar in left testicle.    Antony Haste

## 2013-03-31 MED ORDER — DEXTROSE 5 % IV SOLN
3.0000 g | INTRAVENOUS | Status: AC
  Administered 2013-04-01: 3 g via INTRAVENOUS
  Filled 2013-03-31 (×2): qty 3000

## 2013-03-31 NOTE — Anesthesia Preprocedure Evaluation (Addendum)
Anesthesia Evaluation  Patient identified by MRN, date of birth, ID band Patient awake    Reviewed: Allergy & Precautions, H&P , NPO status , Patient's Chart, lab work & pertinent test results  Airway Mallampati: II TM Distance: >3 FB Neck ROM: full    Dental  (+) Missing and Dental Advisory Given All front teeth are missing:   Pulmonary neg pulmonary ROS, former smoker,  breath sounds clear to auscultation  Pulmonary exam normal       Cardiovascular Exercise Tolerance: Good negative cardio ROS  Rhythm:regular Rate:Normal     Neuro/Psych Cervical fusion negative neurological ROS  negative psych ROS   GI/Hepatic negative GI ROS, Neg liver ROS,   Endo/Other  negative endocrine ROSMorbid obesity  Renal/GU negative Renal ROS  negative genitourinary   Musculoskeletal   Abdominal (+) + obese,   Peds  Hematology negative hematology ROS (+)   Anesthesia Other Findings   Reproductive/Obstetrics negative OB ROS                         Anesthesia Physical Anesthesia Plan  ASA: II  Anesthesia Plan: General   Post-op Pain Management:    Induction: Intravenous  Airway Management Planned: Oral ETT  Additional Equipment:   Intra-op Plan:   Post-operative Plan: Extubation in OR  Informed Consent: I have reviewed the patients History and Physical, chart, labs and discussed the procedure including the risks, benefits and alternatives for the proposed anesthesia with the patient or authorized representative who has indicated his/her understanding and acceptance.   Dental Advisory Given  Plan Discussed with: CRNA and Surgeon  Anesthesia Plan Comments:       Anesthesia Quick Evaluation

## 2013-04-01 ENCOUNTER — Encounter (HOSPITAL_COMMUNITY): Payer: 59 | Admitting: Anesthesiology

## 2013-04-01 ENCOUNTER — Encounter (HOSPITAL_COMMUNITY): Payer: Self-pay | Admitting: *Deleted

## 2013-04-01 ENCOUNTER — Ambulatory Visit (HOSPITAL_COMMUNITY): Payer: 59 | Admitting: Anesthesiology

## 2013-04-01 ENCOUNTER — Ambulatory Visit (HOSPITAL_COMMUNITY)
Admission: RE | Admit: 2013-04-01 | Discharge: 2013-04-01 | Disposition: A | Discharge status: Home / Self Care | Payer: 59 | Source: Ambulatory Visit | Attending: Urology | Admitting: Urology

## 2013-04-01 ENCOUNTER — Encounter (HOSPITAL_COMMUNITY)
Admission: RE | Disposition: A | Discharge status: Home / Self Care | Payer: Self-pay | Source: Ambulatory Visit | Attending: Urology

## 2013-04-01 DIAGNOSIS — Z87891 Personal history of nicotine dependence: Secondary | ICD-10-CM | POA: Insufficient documentation

## 2013-04-01 DIAGNOSIS — N433 Hydrocele, unspecified: Secondary | ICD-10-CM | POA: Insufficient documentation

## 2013-04-01 DIAGNOSIS — E669 Obesity, unspecified: Secondary | ICD-10-CM | POA: Insufficient documentation

## 2013-04-01 DIAGNOSIS — N508 Other specified disorders of male genital organs: Secondary | ICD-10-CM | POA: Insufficient documentation

## 2013-04-01 DIAGNOSIS — L723 Sebaceous cyst: Secondary | ICD-10-CM | POA: Insufficient documentation

## 2013-04-01 DIAGNOSIS — Z981 Arthrodesis status: Secondary | ICD-10-CM | POA: Insufficient documentation

## 2013-04-01 HISTORY — PX: TESTICULAR EXPLORATION: SHX5145

## 2013-04-01 HISTORY — PX: CYSTOSCOPY: SHX5120

## 2013-04-01 LAB — AFP TUMOR MARKER: AFP-Tumor Marker: 5.9 ng/mL (ref 0.0–8.0)

## 2013-04-01 LAB — LUTEINIZING HORMONE: LH: 3.4 m[IU]/mL (ref 1.5–9.3)

## 2013-04-01 LAB — FOLLICLE STIMULATING HORMONE: FSH: 3 m[IU]/mL (ref 1.4–18.1)

## 2013-04-01 LAB — PROLACTIN: PROLACTIN: 16.7 ng/mL (ref 2.1–17.1)

## 2013-04-01 LAB — LACTATE DEHYDROGENASE: LDH: 223 U/L (ref 94–250)

## 2013-04-01 LAB — TESTOSTERONE: TESTOSTERONE: 97 ng/dL — AB (ref 300–890)

## 2013-04-01 SURGERY — CYSTOSCOPY, FLEXIBLE
Anesthesia: General | Laterality: Right

## 2013-04-01 MED ORDER — HYDROMORPHONE HCL PF 1 MG/ML IJ SOLN
INTRAMUSCULAR | Status: AC
  Filled 2013-04-01: qty 1

## 2013-04-01 MED ORDER — NEOSTIGMINE METHYLSULFATE 1 MG/ML IJ SOLN
INTRAMUSCULAR | Status: AC
  Filled 2013-04-01: qty 10

## 2013-04-01 MED ORDER — ATROPINE SULFATE 0.4 MG/ML IJ SOLN
INTRAMUSCULAR | Status: AC
  Filled 2013-04-01: qty 1

## 2013-04-01 MED ORDER — ONDANSETRON HCL 4 MG/2ML IJ SOLN
INTRAMUSCULAR | Status: DC | PRN
  Administered 2013-04-01: 4 mg via INTRAVENOUS

## 2013-04-01 MED ORDER — LIDOCAINE HCL (CARDIAC) 20 MG/ML IV SOLN
INTRAVENOUS | Status: DC | PRN
  Administered 2013-04-01: 100 mg via INTRAVENOUS

## 2013-04-01 MED ORDER — PHENYLEPHRINE HCL 10 MG/ML IJ SOLN
INTRAMUSCULAR | Status: DC | PRN
  Administered 2013-04-01 (×2): 80 ug via INTRAVENOUS

## 2013-04-01 MED ORDER — CEPHALEXIN 500 MG PO CAPS: 500.0000 mg | ORAL_CAPSULE | Freq: Four times a day (QID) | ORAL | Status: DC

## 2013-04-01 MED ORDER — BUPIVACAINE HCL (PF) 0.25 % IJ SOLN
INTRAMUSCULAR | Status: DC | PRN
  Administered 2013-04-01: 9 mL

## 2013-04-01 MED ORDER — OXYCODONE-ACETAMINOPHEN 5-325 MG PO TABS: 1.0000 | ORAL_TABLET | ORAL | Status: AC | PRN

## 2013-04-01 MED ORDER — PROPOFOL 10 MG/ML IV BOLUS
INTRAVENOUS | Status: AC
  Filled 2013-04-01: qty 20

## 2013-04-01 MED ORDER — SODIUM CHLORIDE 0.9 % IR SOLN
Status: DC | PRN
  Administered 2013-04-01: 1000 mL via INTRAVESICAL

## 2013-04-01 MED ORDER — MIDAZOLAM HCL 2 MG/2ML IJ SOLN
INTRAMUSCULAR | Status: AC
  Filled 2013-04-01: qty 2

## 2013-04-01 MED ORDER — FENTANYL CITRATE 0.05 MG/ML IJ SOLN
INTRAMUSCULAR | Status: AC
  Filled 2013-04-01: qty 5

## 2013-04-01 MED ORDER — FENTANYL CITRATE 0.05 MG/ML IJ SOLN
INTRAMUSCULAR | Status: DC | PRN
  Administered 2013-04-01 (×3): 50 ug via INTRAVENOUS
  Administered 2013-04-01: 100 ug via INTRAVENOUS

## 2013-04-01 MED ORDER — LACTATED RINGERS IV SOLN: INTRAVENOUS | Status: DC

## 2013-04-01 MED ORDER — ROCURONIUM BROMIDE 100 MG/10ML IV SOLN
INTRAVENOUS | Status: AC
  Filled 2013-04-01: qty 1

## 2013-04-01 MED ORDER — DEXAMETHASONE SODIUM PHOSPHATE 10 MG/ML IJ SOLN
INTRAMUSCULAR | Status: DC | PRN
  Administered 2013-04-01: 10 mg via INTRAVENOUS

## 2013-04-01 MED ORDER — LIDOCAINE HCL (CARDIAC) 20 MG/ML IV SOLN
INTRAVENOUS | Status: AC
Start: 2013-04-01 — End: 2013-04-01
  Filled 2013-04-01: qty 5

## 2013-04-01 MED ORDER — LACTATED RINGERS IV SOLN
INTRAVENOUS | Status: DC | PRN
  Administered 2013-04-01 (×2): via INTRAVENOUS

## 2013-04-01 MED ORDER — GLYCOPYRROLATE 0.2 MG/ML IJ SOLN
INTRAMUSCULAR | Status: AC
  Filled 2013-04-01: qty 3

## 2013-04-01 MED ORDER — ONDANSETRON HCL 4 MG/2ML IJ SOLN
INTRAMUSCULAR | Status: AC
  Filled 2013-04-01: qty 2

## 2013-04-01 MED ORDER — HYDROMORPHONE HCL PF 1 MG/ML IJ SOLN
0.2500 mg | INTRAMUSCULAR | Status: DC | PRN
  Administered 2013-04-01 (×4): 0.5 mg via INTRAVENOUS

## 2013-04-01 MED ORDER — MIDAZOLAM HCL 5 MG/5ML IJ SOLN
INTRAMUSCULAR | Status: DC | PRN
  Administered 2013-04-01: 2 mg via INTRAVENOUS

## 2013-04-01 MED ORDER — OXYCODONE-ACETAMINOPHEN 5-325 MG PO TABS
1.0000 | ORAL_TABLET | ORAL | Status: DC | PRN
  Administered 2013-04-01: 2 via ORAL
  Filled 2013-04-01: qty 2

## 2013-04-01 MED ORDER — PROPOFOL 10 MG/ML IV BOLUS
INTRAVENOUS | Status: DC | PRN
  Administered 2013-04-01: 200 mg via INTRAVENOUS

## 2013-04-01 MED ORDER — SUCCINYLCHOLINE CHLORIDE 20 MG/ML IJ SOLN
INTRAMUSCULAR | Status: DC | PRN
  Administered 2013-04-01: 160 mg via INTRAVENOUS

## 2013-04-01 MED ORDER — BUPIVACAINE HCL (PF) 0.25 % IJ SOLN
INTRAMUSCULAR | Status: AC
  Filled 2013-04-01: qty 30

## 2013-04-01 MED ORDER — DEXAMETHASONE SODIUM PHOSPHATE 10 MG/ML IJ SOLN
INTRAMUSCULAR | Status: AC
  Filled 2013-04-01: qty 1

## 2013-04-01 SURGICAL SUPPLY — 30 items
BLADE HEX COATED 2.75 (ELECTRODE) ×4 IMPLANT
BNDG GAUZE ELAST 4 BULKY (GAUZE/BANDAGES/DRESSINGS) ×4 IMPLANT
CATH FOLEY 2WAY SLVR  5CC 18FR (CATHETERS) ×2
CATH FOLEY 2WAY SLVR 5CC 18FR (CATHETERS) ×2 IMPLANT
COVER SURGICAL LIGHT HANDLE (MISCELLANEOUS) ×4 IMPLANT
DERMABOND ADVANCED (GAUZE/BANDAGES/DRESSINGS) ×2
DERMABOND ADVANCED .7 DNX12 (GAUZE/BANDAGES/DRESSINGS) ×2 IMPLANT
DRAIN PENROSE 18X1/2 LTX STRL (DRAIN) ×4 IMPLANT
DRAPE CAMERA CLOSED 9X96 (DRAPES) ×4 IMPLANT
DRAPE LAPAROTOMY T 102X78X121 (DRAPES) ×4 IMPLANT
ELECT REM PT RETURN 9FT ADLT (ELECTROSURGICAL) ×4
ELECTRODE REM PT RTRN 9FT ADLT (ELECTROSURGICAL) ×2 IMPLANT
GLOVE BIOGEL M STRL SZ7.5 (GLOVE) ×24 IMPLANT
GOWN STRL REUS W/TWL XL LVL3 (GOWN DISPOSABLE) ×8 IMPLANT
KIT BASIN OR (CUSTOM PROCEDURE TRAY) ×4 IMPLANT
NEEDLE HYPO 22GX1.5 SAFETY (NEEDLE) ×4 IMPLANT
NS IRRIG 1000ML POUR BTL (IV SOLUTION) ×4 IMPLANT
PACK GENERAL/GYN (CUSTOM PROCEDURE TRAY) ×4 IMPLANT
SUPPORT SCROTAL LG STRP (MISCELLANEOUS) ×3 IMPLANT
SUPPORTER ATHLETIC LG (MISCELLANEOUS) ×1
SUT CHROMIC 3 0 SH 27 (SUTURE) IMPLANT
SUT MNCRL AB 3-0 PS2 18 (SUTURE) ×4 IMPLANT
SUT PDS AB 4-0 RB1 27 (SUTURE) ×4 IMPLANT
SUT VIC AB 2-0 SH 27 (SUTURE) ×3
SUT VIC AB 2-0 SH 27X BRD (SUTURE) ×2 IMPLANT
SUT VIC AB 2-0 UR5 27 (SUTURE) IMPLANT
SUT VICRYL 0 TIES 12 18 (SUTURE) IMPLANT
SYR CONTROL 10ML LL (SYRINGE) ×4 IMPLANT
TOWEL OR 17X26 10 PK STRL BLUE (TOWEL DISPOSABLE) ×4 IMPLANT
WATER STERILE IRR 1500ML POUR (IV SOLUTION) IMPLANT

## 2013-04-01 NOTE — Discharge Instructions (Signed)
Testicular Biopsy  HOME CARE INSTRUCTIONS  What you need to do at home may depend on the cause of the hydrocele and type of treatment. In general:  Take all medicine as directed by your caregiver. Follow the directions carefully.  Ask your caregiver if there is anything you should not do while you recover (activities, lifting, work, sex).  If you had surgery to repair a communicating hydrocele, recovery time may vary. Ask you caregiver about your recovery time.  Avoid heavy lifting for 4 to 6 weeks.  If you had an incision on the scrotum or groin, wash it for 2 to 3 days after surgery. Do this as long as the skin is closed and there are no gaps in the wound. Wash gently, and avoid rubbing the incision.  Keep all follow-up appointments. SEEK MEDICAL CARE IF:   Your scrotum seems to be getting larger.  The area becomes more and more uncomfortable. SEEK IMMEDIATE MEDICAL CARE IF:  You have a fever. Document Released: 08/21/2009 Document Revised: 12/22/2012 Document Reviewed: 08/21/2009 [facility] Patient Information 2014 [place], [place].

## 2013-04-01 NOTE — Preoperative (Signed)
Beta Blockers   Reason not to administer Beta Blockers:Not Applicable 

## 2013-04-01 NOTE — Op Note (Signed)
Preop diagnosis: Right testicular mass, microscopic hematuria Postoperative diagnosis: Right testicular mass, right epididymal mass, microscopic hematuria  Procedure: Right inguinal exploration with partial orchiectomy and partial epididymectomy Cystoscopy  Surgeon: Jazman Reuter Type of anesthesia: Gen.  Findings: On exam under anesthesia the penis was not circumcised and was without lesion or mass. There was a left hydrocele but the left testicle was palpably normal. The right testicle was palpably normal.  After delivering the testicle through the wound there was a small cystic mass palpable in the middle of the epididymis. There was a subtle mass in the superior portion of the testicle which was confirmed with intraoperative ultrasound. The tumor was a white, well encapsulated mass that appeared like a small onion, quite distinct from the surrounding normal appearing tan-brown seminiferous tubules.  Frozen section of the right epididymis: Benign cyst  Frozen section of the right testicular mass: Benign tumor, no malignant cells identified. I discussed the case with Dr. Luisa Hart. The tumor itself appeared benign however there was no guarantee some of the seminiferous tubules surrounding it might show malignant cells on permanent pathology. However given the above the Intra-Op findings of the mass, complete excision of the mass and the stability on imaging I felt it was best to leave the testicle in place. Dr. Luisa Hart agreed.  Cystoscopy revealed a normal urethra, normal prostatic urethra, normal trigone and ureteral orifices with clear reflux, normal bladder mucosa without mass or tumor, no foreign body or stone in the bladder.  Description of procedure: After consent was obtained the patient was brought the operating room. After adequate anesthesia the genitalia and lower abdomen were prepped and draped in the usual sterile fashion. A timeout was performed to confirm the patient and procedure. An  inguinal incision was made over the external ring after infiltrating with plain Marcaine. The cord was surrounded with a Penrose to serve as a tourniquet. The tourniquet was tightened. The testicle was delivered and the tunica vaginalis opened. The tunica vaginalis over the epididymal cyst was open and the cyst was excised in its entirety. It was sent for frozen section.  Frozen section of the right epididymis: Benign cyst  Intra-Op ultrasound was performed on the right testicle isolating the tumor toward the upper pole the right testicle. Having seen on ultrasound with deeper palpation after a tourniquet was applied a subtle mass was now palpable. The tunica albuginea was opened and the seminiferous tubules bivalved down to the mass. It was removed in its entirety with sharp dissection and judicious use of the Bovie cautery. Hemostasis was excellent. The mass was sent for frozen section. The tunica albuginea was then run closed with a 4-0 PDS suture. The tunica vaginalis from the prior resection of the epididymal cyst was closed with a short running 4-0 PDS suture. The tourniquet was removed and the testicle observed for 10 minutes and there was excellent hemostasis. The testicle appeared viable. The testicle was irrigated and placed back in the right hemiscrotum. The fascia was closed with interrupted 2-0 Vicryl suture. The skin was closed with a running subcuticular Monocryl and Dermabond.  Flexible cystoscopy was then performed. The bladder was drained with an 18 [place] Foley. The patient was awakened taken to recovery room in stable condition.  Complications: None Blood loss: Minimal Drains: None  Specimens: #1 epididymal mass #2 right testicular mass Specimen sent to pathology  Disposition: Patient stable to PACU

## 2013-04-01 NOTE — Transfer of Care (Signed)
Immediate Anesthesia Transfer of Care Note  Patient: Charles Peck  Procedure(s) Performed: Procedure(s) (LRB): CYSTOSCOPY FLEXIBLE EXCISIONAL BIOPSY  (N/A) RIGHT INGUINAL EXPLORATION (Right)  Patient Location: PACU  Anesthesia Type: General  Level of Consciousness: sedated, patient cooperative and responds to stimulation  Airway & Oxygen Therapy: Patient Spontanous Breathing and Patient connected to face mask oxgen  Post-op Assessment: Report given to PACU RN and Post -op Vital signs reviewed and stable  Post vital signs: Reviewed and stable  Complications: No apparent anesthesia complications

## 2013-04-01 NOTE — Anesthesia Postprocedure Evaluation (Signed)
  Anesthesia Post-op Note  Patient: Charles Peck  Procedure(s) Performed: Procedure(s) (LRB): CYSTOSCOPY FLEXIBLE EXCISIONAL BIOPSY  (N/A) RIGHT INGUINAL EXPLORATION (Right)  Patient Location: PACU  Anesthesia Type: General  Level of Consciousness: awake and alert   Airway and Oxygen Therapy: Patient Spontanous Breathing  Post-op Pain: mild  Post-op Assessment: Post-op Vital signs reviewed, Patient's Cardiovascular Status Stable, Respiratory Function Stable, Patent Airway and No signs of Nausea or vomiting  Last Vitals:  Filed Vitals:   04/01/13 1039  BP: 160/75  Pulse: 56  Temp: 37 C  Resp: 18    Post-op Vital Signs: stable   Complications: No apparent anesthesia complications

## 2013-04-03 LAB — BETA HCG QUANT (REF LAB)

## 2013-04-04 ENCOUNTER — Encounter (HOSPITAL_COMMUNITY): Payer: Self-pay | Admitting: Urology

## 2013-05-19 ENCOUNTER — Other Ambulatory Visit: Payer: Self-pay | Admitting: Family Medicine

## 2013-05-19 DIAGNOSIS — R911 Solitary pulmonary nodule: Secondary | ICD-10-CM

## 2013-05-23 ENCOUNTER — Ambulatory Visit
Admission: RE | Admit: 2013-05-23 | Discharge: 2013-05-23 | Disposition: A | Discharge status: Home / Self Care | Payer: 59 | Source: Ambulatory Visit | Attending: Family Medicine | Admitting: Family Medicine

## 2013-05-23 DIAGNOSIS — R911 Solitary pulmonary nodule: Secondary | ICD-10-CM

## 2013-05-23 MED ORDER — IOHEXOL 300 MG/ML  SOLN
75.0000 mL | Freq: Once | INTRAMUSCULAR | Status: AC | PRN
  Administered 2013-05-23: 75 mL via INTRAVENOUS

## 2013-06-29 ENCOUNTER — Institutional Professional Consult (permissible substitution): Payer: 59 | Admitting: Pulmonary Disease

## 2014-01-30 ENCOUNTER — Encounter (HOSPITAL_COMMUNITY): Payer: Self-pay | Admitting: Emergency Medicine

## 2014-01-30 ENCOUNTER — Emergency Department (HOSPITAL_COMMUNITY)
Admission: EM | Admit: 2014-01-30 | Discharge: 2014-01-30 | Disposition: A | Discharge status: Home / Self Care | Payer: 59 | Attending: Emergency Medicine | Admitting: Emergency Medicine

## 2014-01-30 DIAGNOSIS — Z981 Arthrodesis status: Secondary | ICD-10-CM | POA: Diagnosis not present

## 2014-01-30 DIAGNOSIS — G8929 Other chronic pain: Secondary | ICD-10-CM | POA: Diagnosis not present

## 2014-01-30 DIAGNOSIS — M069 Rheumatoid arthritis, unspecified: Secondary | ICD-10-CM | POA: Diagnosis not present

## 2014-01-30 DIAGNOSIS — Z792 Long term (current) use of antibiotics: Secondary | ICD-10-CM | POA: Insufficient documentation

## 2014-01-30 DIAGNOSIS — M791 Myalgia: Secondary | ICD-10-CM | POA: Diagnosis present

## 2014-01-30 DIAGNOSIS — Z9889 Other specified postprocedural states: Secondary | ICD-10-CM | POA: Insufficient documentation

## 2014-01-30 DIAGNOSIS — Z96651 Presence of right artificial knee joint: Secondary | ICD-10-CM | POA: Insufficient documentation

## 2014-01-30 DIAGNOSIS — Z87891 Personal history of nicotine dependence: Secondary | ICD-10-CM | POA: Insufficient documentation

## 2014-01-30 DIAGNOSIS — M1990 Unspecified osteoarthritis, unspecified site: Secondary | ICD-10-CM | POA: Diagnosis not present

## 2014-01-30 DIAGNOSIS — Z96643 Presence of artificial hip joint, bilateral: Secondary | ICD-10-CM | POA: Insufficient documentation

## 2014-01-30 MED ORDER — OXYCODONE HCL 15 MG PO TABS: 15.0000 mg | ORAL_TABLET | Freq: Four times a day (QID) | ORAL | Status: DC | PRN

## 2014-01-30 MED ORDER — OXYCODONE HCL 5 MG PO TABS
20.0000 mg | ORAL_TABLET | Freq: Once | ORAL | Status: AC
  Administered 2014-01-30: 20 mg via ORAL
  Filled 2014-01-30: qty 4

## 2014-01-30 NOTE — Discharge Instructions (Signed)
Read the information below.  Use the prescribed medication as directed.  Please discuss all new medications with your pharmacist.  You may return to the Emergency Department at any time for worsening condition or any new symptoms that concern you.  Please verify with your pain clinic that you can fill this prescription without being terminated from the pain clinic program.  Follow up with your primary care provider for further pain management until you are able to be seen by the pain clinic.    Chronic Pain Chronic pain can be defined as pain that is off and on and lasts for 3-6 months or longer. Many things cause chronic pain, which can make it difficult to make a diagnosis. There are many treatment options available for chronic pain. However, finding a treatment that works well for you may require trying various approaches until the right one is found. Many people benefit from a combination of two or more types of treatment to control their pain. SYMPTOMS  Chronic pain can occur anywhere in the body and can range from mild to very severe. Some types of chronic pain include:  Headache.  Low back pain.  Cancer pain.  Arthritis pain.  Neurogenic pain. This is pain resulting from damage to nerves. People with chronic pain may also have other symptoms such as:  Depression.  Anger.  Insomnia.  Anxiety. DIAGNOSIS  Your health care provider will help diagnose your condition over time. In many cases, the initial focus will be on excluding possible conditions that could be causing the pain. Depending on your symptoms, your health care provider may order tests to diagnose your condition. Some of these tests may include:   Blood tests.   CT scan.   MRI.   X-rays.   Ultrasounds.   Nerve conduction studies.  You may need to see a specialist.  TREATMENT  Finding treatment that works well may take time. You may be referred to a pain specialist. He or she may prescribe medicine or  therapies, such as:   Mindful meditation or yoga.  Shots (injections) of numbing or pain-relieving medicines into the spine or area of pain.  Local electrical stimulation.  Acupuncture.   Massage therapy.   Aroma, color, light, or sound therapy.   Biofeedback.   Working with a physical therapist to keep from getting stiff.   Regular, gentle exercise.   Cognitive or behavioral therapy.   Group support.  Sometimes, surgery may be recommended.  HOME CARE INSTRUCTIONS   Take all medicines as directed by your health care provider.   Lessen stress in your life by relaxing and doing things such as listening to calming music.   Exercise or be active as directed by your health care provider.   Eat a healthy diet and include things such as vegetables, fruits, fish, and lean meats in your diet.   Keep all follow-up appointments with your health care provider.   Attend a support group with others suffering from chronic pain. SEEK MEDICAL CARE IF:   Your pain gets worse.   You develop a new pain that was not there before.   You cannot tolerate medicines given to you by your health care provider.   You have new symptoms since your last visit with your health care provider.  SEEK IMMEDIATE MEDICAL CARE IF:   You feel weak.   You have decreased sensation or numbness.   You lose control of bowel or bladder function.   Your pain suddenly gets much worse.  You develop shaking.  You develop chills.  You develop confusion.  You develop chest pain.  You develop shortness of breath.  MAKE SURE YOU:  Understand these instructions.  Will watch your condition.  Will get help right away if you are not doing well or get worse. Document Released: 11/23/2001 Document Revised: 11/03/2012 Document Reviewed: 08/27/2012 [facility] Patient Information 2015 [place], [place]. This information is not intended to replace advice given to you by your health care  provider. Make sure you discuss any questions you have with your health care provider.

## 2014-01-30 NOTE — ED Notes (Signed)
Pt takes oxycodone at home.  Will d/c.

## 2014-01-30 NOTE — ED Provider Notes (Signed)
CSN: [id number]     Arrival date & time 01/30/14  1151 History  This chart was scribed for non-physician practitioner, Clayton Bibles, PA-C working with Wandra Arthurs, MD by Frederich Balding, ED scribe. This patient was seen in room WTR8/WTR8 and the patient's care was started at 12:40 PM.    Chief Complaint  Patient presents with  . body pains     The history is provided by the patient. No language interpreter was used.    HPI Comments: Charles Peck is a 43 y.o. male with history of arthritis and degenerative joint disease who presents to the Emergency Department complaining of generalized arthralgias. This is his normal chronic pain and states there are no changes. Denies new fall or injuries. States his doctor recently changed his pain medication from oxycodone 30 mg to oxymorphone HCl 10 mg. Pt tried calling his pain clinic but states they told him there were no openings and to come to the ED if the pain got bad enough. His next appointment is not until 02/16/14. States he stands for 12 hours at work and it worsens the pain. Denies fever, chest pain, SOB, diarrhea, emesis.  Denies any change in his chronic symptoms.  Denies any withdrawal symptoms. Has chronic back pain and chronic joint pain from RA.   Past Medical History  Diagnosis Date  . Degenerative joint disease   . Testicle swelling 03-29-13    exploratory surgery planned of right testicle   Past Surgical History  Procedure Laterality Date  . Cervical fusion  2005  . Lumbar disc surgery  2009  . Hip arthroplasty  2011    bilateral  . Bunionectomy  2012    also foot surgery to both feet  . Total knee arthroplasty  01/12/2012    right knee  . Total knee arthroplasty  01/12/2012    Procedure: TOTAL KNEE ARTHROPLASTY;  Surgeon: Kerin Salen, MD;  Location: [facility];  Service: Orthopedics;  Laterality: Right;  . Cystoscopy N/A 04/01/2013    Procedure: CYSTOSCOPY FLEXIBLE EXCISIONAL BIOPSY ;  Surgeon: Fredricka Bonine, MD;   Location: [facility];  Service: Urology;  Laterality: N/A;  EXCISIONAL BIOPSY    . Testicular exploration Right 04/01/2013    Procedure: RIGHT INGUINAL EXPLORATION;  Surgeon: Fredricka Bonine, MD;  Location: [facility];  Service: Urology;  Laterality: Right;  RIGHT INGUINAL EXPLORATION   No family history on file. History  Substance Use Topics  . Smoking status: Former Smoker    Quit date: 03/17/1996  . Smokeless tobacco: Never Used  . Alcohol Use: No    Review of Systems  Constitutional: Negative for fever.  Respiratory: Negative for shortness of breath.   Cardiovascular: Negative for chest pain.  Gastrointestinal: Negative for vomiting and diarrhea.  Musculoskeletal: Positive for arthralgias.  All other systems reviewed and are negative.  Allergies  Review of patient's allergies indicates no known allergies.  Home Medications   Prior to Admission medications   Medication Sig Start Date End Date Taking? Authorizing Provider  cephALEXin (KEFLEX) 500 MG capsule Take 1 capsule (500 mg total) by mouth 4 (four) times daily. 04/01/13   Festus Aloe, MD  ibuprofen (ADVIL,MOTRIN) 200 MG tablet Take 200 mg by mouth every 6 (six) hours as needed for mild pain or moderate pain.    Historical Provider, MD  oxycodone (ROXICODONE) 30 MG immediate release tablet Take 30 mg by mouth every 4 (four) hours as needed for pain.    Historical Provider, MD  oxyCODONE-acetaminophen (PERCOCET/ROXICET) 5-325 MG per tablet Take 1-2 tablets by mouth every 4 (four) hours as needed for severe pain. 04/01/13 06/08/14  Festus Aloe, MD   BP 157/87 mmHg  Pulse 74  Temp(Src) 98.5 F (36.9 C) (Oral)  Resp 20  SpO2 100%   Physical Exam  Constitutional: He appears well-developed and well-nourished. No distress.  HENT:  Head: Normocephalic and atraumatic.  Neck: Neck supple.  Pulmonary/Chest: Effort normal.  Musculoskeletal:  Joints of hands and wrist without edema, erythema or warmth.    Neurological: He is alert.  Skin: He is not diaphoretic.  Nursing note and vitals reviewed.   ED Course  Procedures (including critical care time)  DIAGNOSTIC STUDIES: Oxygen Saturation is 100% on RA, normal by my interpretation.    COORDINATION OF CARE: 12:50 PM-Discussed treatment plan which includes a short course of pain medication with pt at bedside and pt agreed to plan. Advised pt to follow up with his pain clinic, let them know he was here and try to get an earlier appointment.   Labs Review Labs Reviewed - No data to display  Imaging Review No results found.   EKG Interpretation None       DEA database checked.   Pt previously on Oxycodone 30mg  tablets, regularly filled every month, #180 pills, last 12/21/13.  01/19/14 given #42 Oxycodone 30mg  tabs.  01/25/14 given Oxymorphone HCl 10mg  tabs #138.    Opiod equivalency table - 10mg  oxymorphone = 20mg  oxycodone  MDM   Final diagnoses:  Chronic pain    Afebrile, nontoxic patient with chronic pain that is completely unchanged.  DEA shows [facility] writes prescriptions monthly with recent change, just as patient describes.  His pain is uncontrolled currently and he has been trying to get back in with the pain clinic but has been unsuccessful.  I have discussed with him my concerns that he is on pain contract and may get kicked out of the practice if he fills a prescription from the ED and I have strongly advised him to discuss this with the pain clinic before filling the prescription to clarify the rules.  He does want a prescription, he states, because they have spoken with several different people at [facility] who all told him to come here and he feels that this means it should not be a violation of his contract since he came here because they could not fit him in.   D/C home with #15 oxycodone 15mg , PCP, pain management follow up.  Discussed result, findings, treatment, and follow up  with patient.  Pt given return precautions.   Pt verbalizes understanding and agrees with plan.       I personally performed the services described in this documentation, which was scribed in my presence. The recorded information has been reviewed and is accurate.  Clayton Bibles, PA-C 01/30/14 [place], MD 01/30/14 [phone]

## 2014-01-30 NOTE — ED Notes (Signed)
Pt states he has arthritis and DJD, doctor switched pain meds and pt states the new meds are not working.

## 2015-04-18 ENCOUNTER — Other Ambulatory Visit: Payer: Self-pay | Admitting: Orthopedic Surgery

## 2015-04-18 ENCOUNTER — Other Ambulatory Visit (HOSPITAL_COMMUNITY): Payer: Self-pay | Admitting: *Deleted

## 2015-04-18 ENCOUNTER — Inpatient Hospital Stay (HOSPITAL_COMMUNITY): Payer: 59 | Admitting: Certified Registered"

## 2015-04-18 ENCOUNTER — Inpatient Hospital Stay (HOSPITAL_COMMUNITY): Payer: 59 | Admitting: Vascular Surgery

## 2015-04-18 NOTE — Pre-Procedure Instructions (Signed)
    Charles Peck  04/18/2015      Your procedure is scheduled on Friday, April 20, 2015 at 12:05 PM.   Report to [facility] Entrance "A" Admitting Office at 8:00 AM.   Call this number if you have problems the morning of surgery: [phone]    Remember:  Do not eat food or drink liquids after midnight tonight.  Take these medicines the morning of surgery with A SIP OF WATER: Oxycodone - if needed   Do not wear jewelry.  Do not wear lotions, powders, or cologne.  You may wear deodorant.  Men may shave face and neck.  Do not bring valuables to the hospital.  [facility] is not responsible for any belongings or valuables.  Contacts, dentures or bridgework may not be worn into surgery.  Leave your suitcase in the car.  After surgery it may be brought to your room.  For patients admitted to the hospital, discharge time will be determined by your treatment team.  Special instructions:  See "Preparing for Surgery" Instruction sheet.  Please read over the following fact sheets that you were given. Pain Booklet, Coughing and Deep Breathing, Blood Transfusion Information, MRSA Information and Surgical Site Infection Prevention

## 2015-04-19 ENCOUNTER — Encounter (HOSPITAL_COMMUNITY)
Admission: RE | Admit: 2015-04-19 | Discharge: 2015-04-19 | Disposition: A | Discharge status: Home / Self Care | Payer: 59 | Source: Ambulatory Visit | Attending: Orthopedic Surgery | Admitting: Orthopedic Surgery

## 2015-04-19 ENCOUNTER — Encounter (HOSPITAL_COMMUNITY): Payer: Self-pay

## 2015-04-19 ENCOUNTER — Other Ambulatory Visit: Payer: Self-pay | Admitting: Orthopedic Surgery

## 2015-04-19 DIAGNOSIS — Z01818 Encounter for other preprocedural examination: Secondary | ICD-10-CM

## 2015-04-19 DIAGNOSIS — M1711 Unilateral primary osteoarthritis, right knee: Secondary | ICD-10-CM | POA: Diagnosis present

## 2015-04-19 DIAGNOSIS — Z539 Procedure and treatment not carried out, unspecified reason: Secondary | ICD-10-CM | POA: Diagnosis not present

## 2015-04-19 HISTORY — DX: Headache, unspecified: R51.9

## 2015-04-19 HISTORY — DX: Headache: R51

## 2015-04-19 HISTORY — DX: Rheumatoid arthritis, unspecified: M06.9

## 2015-04-19 LAB — URINALYSIS, ROUTINE W REFLEX MICROSCOPIC
Bilirubin Urine: NEGATIVE
GLUCOSE, UA: NEGATIVE mg/dL
Hgb urine dipstick: NEGATIVE
KETONES UR: NEGATIVE mg/dL
NITRITE: POSITIVE — AB
PROTEIN: NEGATIVE mg/dL
Specific Gravity, Urine: 1.017 (ref 1.005–1.030)
pH: 6 (ref 5.0–8.0)

## 2015-04-19 LAB — URINE MICROSCOPIC-ADD ON

## 2015-04-19 LAB — CBC WITH DIFFERENTIAL/PLATELET
BASOS PCT: 0 %
Basophils Absolute: 0 10*3/uL (ref 0.0–0.1)
EOS ABS: 0.1 10*3/uL (ref 0.0–0.7)
Eosinophils Relative: 2 %
HCT: 37.8 % — ABNORMAL LOW (ref 39.0–52.0)
HEMOGLOBIN: 11.8 g/dL — AB (ref 13.0–17.0)
Lymphocytes Relative: 24 %
Lymphs Abs: 1 10*3/uL (ref 0.7–4.0)
MCH: 25.9 pg — ABNORMAL LOW (ref 26.0–34.0)
MCHC: 31.2 g/dL (ref 30.0–36.0)
MCV: 82.9 fL (ref 78.0–100.0)
MONOS PCT: 7 %
Monocytes Absolute: 0.3 10*3/uL (ref 0.1–1.0)
NEUTROS PCT: 67 %
Neutro Abs: 3 10*3/uL (ref 1.7–7.7)
Platelets: 161 10*3/uL (ref 150–400)
RBC: 4.56 MIL/uL (ref 4.22–5.81)
RDW: 14.6 % (ref 11.5–15.5)
WBC: 4.4 10*3/uL (ref 4.0–10.5)

## 2015-04-19 LAB — BASIC METABOLIC PANEL
Anion gap: 10 (ref 5–15)
BUN: 7 mg/dL (ref 6–20)
CALCIUM: 9.2 mg/dL (ref 8.9–10.3)
CO2: 27 mmol/L (ref 22–32)
CREATININE: 0.82 mg/dL (ref 0.61–1.24)
Chloride: 105 mmol/L (ref 101–111)
GFR calc non Af Amer: >60 mL/min (ref >60)
Glucose, Bld: 94 mg/dL (ref 65–99)
Potassium: 3.6 mmol/L (ref 3.5–5.1)
SODIUM: 142 mmol/L (ref 135–145)

## 2015-04-19 LAB — SURGICAL PCR SCREEN
MRSA, PCR: NEGATIVE
STAPHYLOCOCCUS AUREUS: NEGATIVE

## 2015-04-19 LAB — APTT: APTT: 32 s (ref 24–37)

## 2015-04-19 LAB — PROTIME-INR
INR: 0.97 (ref 0.00–1.49)
PROTHROMBIN TIME: 13.1 s (ref 11.6–15.2)

## 2015-04-19 LAB — SEDIMENTATION RATE: SED RATE: 36 mm/h — AB (ref 0–16)

## 2015-04-19 LAB — C-REACTIVE PROTEIN

## 2015-04-19 MED ORDER — CEFAZOLIN SODIUM 10 G IJ SOLR
3.0000 g | INTRAMUSCULAR | Status: DC
  Filled 2015-04-19: qty 3000

## 2015-04-19 NOTE — Pre-Procedure Instructions (Signed)
    Charles Peck  04/19/2015     Report to [facility] Entrance "A" Admitting Office at 10:00 AM.  Your procedure is scheduled on Friday, April 20, 2015 at 12:05 PM.   Call this number if you have problems the morning of surgery: [phone]    For any other questions, please call [phone], Monday - Friday 8 AM - 4 PM.    Remember:  Do not eat food or drink liquids after midnight tonight.  Take these medicines the morning of surgery with A SIP OF WATER: Oxycodone - if needed   Do not wear jewelry.  Do not wear lotions, powders, or cologne.   Men may shave face and neck.  Do not bring valuables to the hospital.  [facility] is not responsible for any belongings or valuables.  Contacts, dentures or bridgework may not be worn into surgery.  Leave your suitcase in the car.  After surgery it may be brought to your room.  For patients admitted to the hospital, discharge time will be determined by your treatment team.  Special instructions:  See "Preparing for Surgery" Instruction sheet.  Please read over the following fact sheets that you were given. Pain Booklet, Coughing and Deep Breathing, Blood Transfusion Information, MRSA Information and Surgical Site Infection Prevention

## 2015-04-19 NOTE — Progress Notes (Signed)
   04/19/15 0840  OBSTRUCTIVE SLEEP APNEA  Have you ever been diagnosed with sleep apnea through a sleep study? No  Do you snore loudly (loud enough to be heard through closed doors)?  1  Do you often feel tired, fatigued, or sleepy during the daytime (such as falling asleep during driving or talking to someone)? 0  Has anyone observed you stop breathing during your sleep? 1  Do you have, or are you being treated for high blood pressure? 0  BMI more than 35 kg/m2? 1  Age > 50 (1-yes) 0  Neck circumference greater than:Male 16 inches or larger, Male 17inches or larger? 1 (57.5)  Male Gender (Yes=1) 1  Obstructive Sleep Apnea Score 5  Score 5 or greater  Results sent to PCP

## 2015-04-19 NOTE — H&P (Signed)
TOTAL KNEE REVISION ADMISSION H&P  Patient is being admitted for right revision total knee arthroplasty.  Subjective:  Chief Complaint:right knee pain.  HPI: Charles Peck, 45 y.o. male, has a history of pain and functional disability in the right knee(s) due to failed previous arthroplasty and patient has failed non-surgical conservative treatments for greater than 12 weeks to include NSAID's and/or analgesics, use of assistive devices and activity modification. The indications for the revision of the total knee arthroplasty are loosening of one or more components. Onset of symptoms was abrupt starting 1 years ago with rapidlly worsening course since that time.  Prior procedures on the right knee(s) include arthroplasty.  Patient currently rates pain in the right knee(s) at 10 out of 10 with activity. There is night pain, worsening of pain with activity and weight bearing, pain that interferes with activities of daily living, pain with passive range of motion and joint swelling.  Patient has evidence of prosthetic loosening by imaging studies. This condition presents safety issues increasing the risk of falls.   There is no current active infection.  Patient Active Problem List   Diagnosis Date Noted  . Osteoarthritis of right knee 01/15/2012  . Nonspecific (abnormal) findings on radiological and other examination of body structure 06/28/2009  . CT, CHEST, ABNORMAL 06/28/2009  . DISC DISEASE, LUMBAR 06/27/2009   Past Medical History  Diagnosis Date  . Degenerative joint disease   . Testicle swelling 03-29-13    exploratory surgery planned of right testicle  . Headache     not in a while    . RA (rheumatoid arthritis) (HCC)     Past Surgical History  Procedure Laterality Date  . Cervical fusion  2005  . Lumbar disc surgery  2009  . Hip arthroplasty  2011    bilateral 1 month  . Bunionectomy  2012    also foot surgery to both feet  . Total knee arthroplasty  01/12/2012   Procedure: TOTAL KNEE ARTHROPLASTY;  Surgeon: Frank J Rowan, MD;  Location: [facility];  Service: Orthopedics;  Laterality: Right;  . Cystoscopy N/A 04/01/2013    Procedure: CYSTOSCOPY FLEXIBLE EXCISIONAL BIOPSY ;  Surgeon: Matthew Ramsey Eskridge, MD;  Location: [facility];  Service: Urology;  Laterality: N/A;  EXCISIONAL BIOPSY   . Testicular exploration Right 04/01/2013    Procedure: RIGHT INGUINAL EXPLORATION;  Surgeon: Matthew Ramsey Eskridge, MD;  Location: [facility];  Service: Urology;  Laterality: Right;  RIGHT INGUINAL EXPLORATION    No prescriptions prior to admission   No Known Allergies  Social History  Substance Use Topics  . Smoking status: Former Smoker    Quit date: 03/17/1996  . Smokeless tobacco: Never Used     Comment: was a light, social smoker  . Alcohol Use: No    No family history on file.    Review of Systems  Constitutional: Negative.   HENT: Negative.   Eyes: Negative.   Respiratory: Negative.   Cardiovascular: Positive for leg swelling.  Gastrointestinal: Negative.   Genitourinary: Negative.   Musculoskeletal: Positive for myalgias and joint pain.       RA  Skin: Negative.   Neurological: Negative.   Endo/Heme/Allergies: Negative.   Psychiatric/Behavioral: Negative.      Objective:  Physical Exam  Constitutional: He is oriented to person, place, and time. He appears well-developed and well-nourished.  HENT:  Head: Normocephalic and atraumatic.  Eyes: Pupils are equal, round, and reactive to light.  Neck: Normal range of motion. Neck supple.    Cardiovascular: Intact distal pulses.   Respiratory: Effort normal.  Musculoskeletal: He exhibits tenderness.  The right knee hasn't obvious 10 valgus deformity tender along the lateral distal aspect of the femur no palpable effusion.  Range of motion is 5-85 collateral ligaments reveal laxity to valgus stress.  One plus with a good endpoint, but also exacerbates his pain.  Varus stress does not cause any discomfort.   He is neurovascularly intact distally.  His height is 6 feet 4 inches and he weighs 320+ pounds.  Neurological: He is alert and oriented to person, place, and time.  Skin: Skin is warm and dry.  Psychiatric: He has a normal mood and affect. His behavior is normal. Judgment and thought content normal.    Vital signs in last 24 hours: Temp:  [98.9 F (37.2 C)] 98.9 F (37.2 C) (02/02 0827) Pulse Rate:  [66] 66 (02/02 0827) Resp:  [20] 20 (02/02 0827) BP: (120)/(82) 120/82 mmHg (02/02 0827) SpO2:  [95 %] 95 % (02/02 0827) Weight:  [154.495 kg (340 lb 9.6 oz)] 154.495 kg (340 lb 9.6 oz) (02/02 0827)  Labs:  Estimated body mass index is 38.18 kg/(m^2) as calculated from the following:   Height as of 03/29/13: 6' 4" (1.93 m).   Weight as of 03/29/13: 142.203 kg (313 lb 8 oz).  Imaging Review Plain radiographs demonstrate the patient's femoral component has angulated into a valgus deformity of 10 and there is been partial collapse of the lateral femoral condyle.  Tibial component appears to be well placed and well fixed as does the patellar component.  Assessment/Plan:  End stage arthritis, right knee(s) with failed previous arthroplasty.   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  The patient history, physical examination, clinical judgment of the provider and imaging studies are consistent with end stage degenerative joint disease of the right knee(s), previous total knee arthroplasty. Revision total knee arthroplasty is deemed medically necessary. The treatment options including medical management, injection therapy, arthroscopy and revision arthroplasty were discussed at length. The risks and benefits of revision total knee arthroplasty were presented and reviewed. The risks due to aseptic loosening, infection, stiffness, patella tracking problems, thromboembolic complications and other imponderables  were discussed. The patient acknowledged the explanation, agreed to proceed with the plan and consent was signed. Patient is being admitted for inpatient treatment for surgery, pain control, PT, OT, prophylactic antibiotics, VTE prophylaxis, progressive ambulation and ADL's and discharge planning.The patient is planning to be discharged home with home health services   

## 2015-04-19 NOTE — Pre-Procedure Instructions (Signed)
    [name]  04/19/2015     Report to [facility] Entrance "A" Admitting Office at 10:00 AM.  Your procedure is scheduled on Friday, April 20, 2015 at 12:05 PM.   Call this number if you have problems the morning of surgery: [phone]    For any other questions, please call [phone], Monday - Friday 8 AM - 4 PM.    Remember:  Do not eat food or drink liquids after midnight tonight.  Take these medicines the morning of surgery with A SIP OF WATER: Oxycodone - if needed                Stop taking Ibuprofen   Do not wear jewelry.  Do not wear lotions, powders, or cologne.   Men may shave face and neck.  Do not bring valuables to the hospital.  [facility] is not responsible for any belongings or valuables.  Contacts, dentures or bridgework may not be worn into surgery.  Leave your suitcase in the car.  After surgery it may be brought to your room.  For patients admitted to the hospital, discharge time will be determined by your treatment team.  Special instructions:  See "Preparing for Surgery" Instruction sheet.  Please read over the following fact sheets that you were given. Pain Booklet, Coughing and Deep Breathing, Blood Transfusion Information, MRSA Information and Surgical Site Infection Prevention

## 2015-04-20 ENCOUNTER — Encounter (HOSPITAL_COMMUNITY): Payer: Self-pay | Admitting: *Deleted

## 2015-04-20 ENCOUNTER — Ambulatory Visit (HOSPITAL_COMMUNITY)
Admission: RE | Admit: 2015-04-20 | Discharge: 2015-04-20 | DRG: 554 | Disposition: A | Discharge status: Home / Self Care | Payer: 59 | Source: Ambulatory Visit | Attending: Orthopedic Surgery | Admitting: Orthopedic Surgery

## 2015-04-20 ENCOUNTER — Encounter (HOSPITAL_COMMUNITY)
Admission: RE | Disposition: A | Discharge status: Home / Self Care | Payer: Self-pay | Source: Ambulatory Visit | Attending: Orthopedic Surgery

## 2015-04-20 DIAGNOSIS — Z96659 Presence of unspecified artificial knee joint: Secondary | ICD-10-CM

## 2015-04-20 DIAGNOSIS — Z539 Procedure and treatment not carried out, unspecified reason: Secondary | ICD-10-CM | POA: Diagnosis present

## 2015-04-20 DIAGNOSIS — M1711 Unilateral primary osteoarthritis, right knee: Secondary | ICD-10-CM | POA: Diagnosis not present

## 2015-04-20 DIAGNOSIS — T84038A Mechanical loosening of other internal prosthetic joint, initial encounter: Secondary | ICD-10-CM

## 2015-04-20 SURGERY — TOTAL KNEE REVISION
Anesthesia: Monitor Anesthesia Care | Laterality: Right

## 2015-04-20 MED ORDER — FENTANYL CITRATE (PF) 250 MCG/5ML IJ SOLN
INTRAMUSCULAR | Status: AC
  Filled 2015-04-20: qty 5

## 2015-04-20 MED ORDER — LACTATED RINGERS IV SOLN
INTRAVENOUS | Status: DC
  Administered 2015-04-20: 12:00:00 via INTRAVENOUS

## 2015-04-20 MED ORDER — CHLORHEXIDINE GLUCONATE 4 % EX LIQD: 60.0000 mL | Freq: Once | CUTANEOUS | Status: DC

## 2015-04-20 MED ORDER — ONDANSETRON HCL 4 MG/2ML IJ SOLN
INTRAMUSCULAR | Status: AC
  Filled 2015-04-20: qty 2

## 2015-04-20 MED ORDER — DEXTROSE-NACL 5-0.45 % IV SOLN: INTRAVENOUS | Status: DC

## 2015-04-20 MED ORDER — MIDAZOLAM HCL 2 MG/2ML IJ SOLN
INTRAMUSCULAR | Status: AC
  Filled 2015-04-20: qty 2

## 2015-04-20 MED ORDER — DEXTROSE 5 % IV SOLN: 3.0000 g | INTRAVENOUS | Status: DC

## 2015-04-20 MED ORDER — FENTANYL CITRATE (PF) 100 MCG/2ML IJ SOLN
INTRAMUSCULAR | Status: AC
  Filled 2015-04-20: qty 2

## 2015-04-20 MED ORDER — TRANEXAMIC ACID 1000 MG/10ML IV SOLN
2000.0000 mg | Freq: Once | INTRAVENOUS | Status: DC
  Filled 2015-04-20: qty 20

## 2015-04-20 MED ORDER — LIDOCAINE HCL (CARDIAC) 20 MG/ML IV SOLN
INTRAVENOUS | Status: AC
  Filled 2015-04-20: qty 5

## 2015-04-20 MED ORDER — ROCURONIUM BROMIDE 50 MG/5ML IV SOLN
INTRAVENOUS | Status: AC
  Filled 2015-04-20: qty 1

## 2015-04-20 MED ORDER — BUPIVACAINE LIPOSOME 1.3 % IJ SUSP
20.0000 mL | Freq: Once | INTRAMUSCULAR | Status: DC
  Filled 2015-04-20: qty 20

## 2015-04-20 MED ORDER — PROPOFOL 10 MG/ML IV BOLUS
INTRAVENOUS | Status: AC
  Filled 2015-04-20: qty 20

## 2015-04-20 MED ORDER — CEFUROXIME SODIUM 1.5 G IJ SOLR
INTRAMUSCULAR | Status: AC
  Filled 2015-04-20: qty 1.5

## 2015-04-20 MED ORDER — TRANEXAMIC ACID 1000 MG/10ML IV SOLN
1000.0000 mg | INTRAVENOUS | Status: DC
  Filled 2015-04-20: qty 10

## 2015-04-20 NOTE — Anesthesia Preprocedure Evaluation (Addendum)
Anesthesia Evaluation  Patient identified by MRN, date of birth, ID band Patient awake    Reviewed: Allergy & Precautions, NPO status , Patient's Chart, lab work & pertinent test results  History of Anesthesia Complications Negative for: history of anesthetic complications  Airway Mallampati: II  TM Distance: >3 FB Neck ROM: Full    Dental  (+) Partial Lower, Partial Upper, Poor Dentition, Dental Advisory Given   Pulmonary neg shortness of breath, neg sleep apnea, neg COPD, neg recent URI, former smoker, neg PE   breath sounds clear to auscultation       Cardiovascular negative cardio ROS   Rhythm:Regular     Neuro/Psych  Headaches, negative psych ROS   GI/Hepatic negative GI ROS,   Endo/Other  Morbid obesity  Renal/GU negative Renal ROS     Musculoskeletal  (+) Arthritis ,   Abdominal   Peds  Hematology  (+) anemia ,   Anesthesia Other Findings   Reproductive/Obstetrics                            Anesthesia Physical Anesthesia Plan  ASA: II  Anesthesia Plan: MAC and Spinal   Post-op Pain Management:    Induction: Intravenous  Airway Management Planned: Natural Airway, Nasal Cannula and Simple Face Mask  Additional Equipment: None  Intra-op Plan:   Post-operative Plan: Extubation in OR  Informed Consent: I have reviewed the patients History and Physical, chart, labs and discussed the procedure including the risks, benefits and alternatives for the proposed anesthesia with the patient or authorized representative who has indicated his/her understanding and acceptance.   Dental advisory given  Plan Discussed with: CRNA and Surgeon  Anesthesia Plan Comments:         Anesthesia Quick Evaluation

## 2015-05-01 ENCOUNTER — Encounter (HOSPITAL_COMMUNITY): Payer: Self-pay | Admitting: *Deleted

## 2015-05-02 ENCOUNTER — Inpatient Hospital Stay (HOSPITAL_COMMUNITY): Payer: 59 | Admitting: Certified Registered"

## 2015-05-02 ENCOUNTER — Encounter (HOSPITAL_COMMUNITY)
Admission: RE | Disposition: A | Discharge status: Home Health | Payer: Self-pay | Source: Ambulatory Visit | Attending: Orthopedic Surgery

## 2015-05-02 ENCOUNTER — Inpatient Hospital Stay (HOSPITAL_COMMUNITY)
Admission: RE | Admit: 2015-05-02 | Discharge: 2015-05-05 | DRG: 467 | Disposition: A | Discharge status: Home Health | Payer: 59 | Source: Ambulatory Visit | Attending: Orthopedic Surgery | Admitting: Orthopedic Surgery

## 2015-05-02 ENCOUNTER — Encounter (HOSPITAL_COMMUNITY): Payer: Self-pay | Admitting: Certified Registered"

## 2015-05-02 DIAGNOSIS — G8929 Other chronic pain: Secondary | ICD-10-CM | POA: Diagnosis present

## 2015-05-02 DIAGNOSIS — Y792 Prosthetic and other implants, materials and accessory orthopedic devices associated with adverse incidents: Secondary | ICD-10-CM | POA: Diagnosis present

## 2015-05-02 DIAGNOSIS — D62 Acute posthemorrhagic anemia: Secondary | ICD-10-CM | POA: Diagnosis not present

## 2015-05-02 DIAGNOSIS — Z981 Arthrodesis status: Secondary | ICD-10-CM | POA: Diagnosis not present

## 2015-05-02 DIAGNOSIS — M24669 Ankylosis, unspecified knee: Secondary | ICD-10-CM | POA: Diagnosis present

## 2015-05-02 DIAGNOSIS — Z6841 Body Mass Index (BMI) 40.0 and over, adult: Secondary | ICD-10-CM

## 2015-05-02 DIAGNOSIS — M545 Low back pain: Secondary | ICD-10-CM | POA: Diagnosis present

## 2015-05-02 DIAGNOSIS — M24661 Ankylosis, right knee: Principal | ICD-10-CM | POA: Diagnosis present

## 2015-05-02 DIAGNOSIS — M069 Rheumatoid arthritis, unspecified: Secondary | ICD-10-CM | POA: Diagnosis present

## 2015-05-02 DIAGNOSIS — L910 Hypertrophic scar: Secondary | ICD-10-CM | POA: Diagnosis present

## 2015-05-02 DIAGNOSIS — Z96643 Presence of artificial hip joint, bilateral: Secondary | ICD-10-CM | POA: Diagnosis present

## 2015-05-02 DIAGNOSIS — Z87891 Personal history of nicotine dependence: Secondary | ICD-10-CM

## 2015-05-02 DIAGNOSIS — T84032A Mechanical loosening of internal right knee prosthetic joint, initial encounter: Secondary | ICD-10-CM | POA: Diagnosis present

## 2015-05-02 DIAGNOSIS — M21061 Valgus deformity, not elsewhere classified, right knee: Secondary | ICD-10-CM | POA: Diagnosis present

## 2015-05-02 HISTORY — PX: TOTAL KNEE REVISION: SHX996

## 2015-05-02 HISTORY — DX: Urinary tract infection, site not specified: N39.0

## 2015-05-02 LAB — TYPE AND SCREEN
ABO/RH(D): O NEG
ABO/RH(D): O NEG
Antibody Screen: NEGATIVE
Antibody Screen: NEGATIVE

## 2015-05-02 SURGERY — TOTAL KNEE REVISION
Anesthesia: Monitor Anesthesia Care | Site: Knee | Laterality: Right

## 2015-05-02 MED ORDER — BUPIVACAINE LIPOSOME 1.3 % IJ SUSP
20.0000 mL | Freq: Once | INTRAMUSCULAR | Status: AC
  Administered 2015-05-02: 20 mL
  Filled 2015-05-02: qty 20

## 2015-05-02 MED ORDER — ONDANSETRON HCL 4 MG PO TABS: 4.0000 mg | ORAL_TABLET | Freq: Four times a day (QID) | ORAL | Status: DC | PRN

## 2015-05-02 MED ORDER — ALUM & MAG HYDROXIDE-SIMETH 200-200-20 MG/5ML PO SUSP: 30.0000 mL | ORAL | Status: DC | PRN

## 2015-05-02 MED ORDER — ACETAMINOPHEN 325 MG PO TABS
650.0000 mg | ORAL_TABLET | Freq: Four times a day (QID) | ORAL | Status: DC | PRN
  Administered 2015-05-03: 650 mg via ORAL
  Filled 2015-05-02: qty 2

## 2015-05-02 MED ORDER — BISACODYL 5 MG PO TBEC
5.0000 mg | DELAYED_RELEASE_TABLET | Freq: Every day | ORAL | Status: DC | PRN
  Filled 2015-05-02: qty 1

## 2015-05-02 MED ORDER — SODIUM CHLORIDE 0.9 % IJ SOLN
INTRAMUSCULAR | Status: DC | PRN
  Administered 2015-05-02: 20 mL

## 2015-05-02 MED ORDER — DEXTROSE 5 % IV SOLN: 3.0000 g | Freq: Once | INTRAVENOUS | Status: DC

## 2015-05-02 MED ORDER — HYDROMORPHONE HCL 1 MG/ML IJ SOLN
0.2500 mg | INTRAMUSCULAR | Status: DC | PRN
  Administered 2015-05-02 (×2): 0.5 mg via INTRAVENOUS

## 2015-05-02 MED ORDER — OXYCODONE HCL 5 MG PO TABS: 5.0000 mg | ORAL_TABLET | ORAL | Status: DC | PRN

## 2015-05-02 MED ORDER — CIPROFLOXACIN HCL 500 MG PO TABS
500.0000 mg | ORAL_TABLET | Freq: Two times a day (BID) | ORAL | Status: DC
  Administered 2015-05-02 – 2015-05-05 (×6): 500 mg via ORAL
  Filled 2015-05-02 (×6): qty 1

## 2015-05-02 MED ORDER — SODIUM CHLORIDE 0.9 % IJ SOLN
INTRAMUSCULAR | Status: AC
  Filled 2015-05-02: qty 10

## 2015-05-02 MED ORDER — MIDAZOLAM HCL 2 MG/2ML IJ SOLN
INTRAMUSCULAR | Status: AC
  Filled 2015-05-02: qty 2

## 2015-05-02 MED ORDER — HYDROMORPHONE HCL 1 MG/ML IJ SOLN
1.0000 mg | INTRAMUSCULAR | Status: DC | PRN
  Administered 2015-05-02 – 2015-05-03 (×2): 1 mg via INTRAVENOUS
  Filled 2015-05-02 (×2): qty 1

## 2015-05-02 MED ORDER — BUPIVACAINE HCL (PF) 0.25 % IJ SOLN
INTRAMUSCULAR | Status: AC
  Filled 2015-05-02: qty 30

## 2015-05-02 MED ORDER — CEFUROXIME SODIUM 1.5 G IJ SOLR
INTRAMUSCULAR | Status: DC | PRN
  Administered 2015-05-02: 1.5 g

## 2015-05-02 MED ORDER — HYDROMORPHONE HCL 1 MG/ML IJ SOLN
INTRAMUSCULAR | Status: AC
  Administered 2015-05-02: 0.5 mg via INTRAVENOUS
  Filled 2015-05-02: qty 1

## 2015-05-02 MED ORDER — FENTANYL CITRATE (PF) 250 MCG/5ML IJ SOLN
INTRAMUSCULAR | Status: AC
  Filled 2015-05-02: qty 5

## 2015-05-02 MED ORDER — PROPOFOL 500 MG/50ML IV EMUL
INTRAVENOUS | Status: DC | PRN
  Administered 2015-05-02: 75 ug/kg/min via INTRAVENOUS

## 2015-05-02 MED ORDER — FLEET ENEMA 7-19 GM/118ML RE ENEM: 1.0000 | ENEMA | Freq: Once | RECTAL | Status: DC | PRN

## 2015-05-02 MED ORDER — SODIUM CHLORIDE 0.9 % IR SOLN
Status: DC | PRN
  Administered 2015-05-02: 1000 mL

## 2015-05-02 MED ORDER — LACTATED RINGERS IV SOLN
INTRAVENOUS | Status: DC | PRN
  Administered 2015-05-02 (×2): via INTRAVENOUS

## 2015-05-02 MED ORDER — EPHEDRINE SULFATE 50 MG/ML IJ SOLN
INTRAMUSCULAR | Status: AC
  Filled 2015-05-02: qty 3

## 2015-05-02 MED ORDER — OXYCODONE HCL 5 MG PO TABS
30.0000 mg | ORAL_TABLET | ORAL | Status: DC | PRN
  Administered 2015-05-02 – 2015-05-03 (×3): 30 mg via ORAL
  Filled 2015-05-02 (×3): qty 6

## 2015-05-02 MED ORDER — ASPIRIN EC 325 MG PO TBEC
325.0000 mg | DELAYED_RELEASE_TABLET | Freq: Every day | ORAL | Status: DC
  Administered 2015-05-03 – 2015-05-05 (×3): 325 mg via ORAL
  Filled 2015-05-02 (×4): qty 1

## 2015-05-02 MED ORDER — MIDAZOLAM HCL 5 MG/5ML IJ SOLN
INTRAMUSCULAR | Status: DC | PRN
  Administered 2015-05-02: 2 mg via INTRAVENOUS

## 2015-05-02 MED ORDER — INDOMETHACIN ER 75 MG PO CPCR: 75.0000 mg | ORAL_CAPSULE | Freq: Two times a day (BID) | ORAL | Status: DC

## 2015-05-02 MED ORDER — PROPOFOL 10 MG/ML IV BOLUS
INTRAVENOUS | Status: DC | PRN
  Administered 2015-05-02 (×3): 20 mg via INTRAVENOUS

## 2015-05-02 MED ORDER — DOCUSATE SODIUM 100 MG PO CAPS
100.0000 mg | ORAL_CAPSULE | Freq: Two times a day (BID) | ORAL | Status: DC
  Administered 2015-05-02 – 2015-05-05 (×6): 100 mg via ORAL
  Filled 2015-05-02 (×6): qty 1

## 2015-05-02 MED ORDER — METOCLOPRAMIDE HCL 5 MG PO TABS: 5.0000 mg | ORAL_TABLET | Freq: Three times a day (TID) | ORAL | Status: DC | PRN

## 2015-05-02 MED ORDER — ACETAMINOPHEN 650 MG RE SUPP: 650.0000 mg | Freq: Four times a day (QID) | RECTAL | Status: DC | PRN

## 2015-05-02 MED ORDER — METHOCARBAMOL 500 MG PO TABS
500.0000 mg | ORAL_TABLET | Freq: Four times a day (QID) | ORAL | Status: DC | PRN
  Administered 2015-05-02 – 2015-05-05 (×8): 500 mg via ORAL
  Filled 2015-05-02 (×8): qty 1

## 2015-05-02 MED ORDER — ONDANSETRON HCL 4 MG/2ML IJ SOLN: 4.0000 mg | Freq: Four times a day (QID) | INTRAMUSCULAR | Status: DC | PRN

## 2015-05-02 MED ORDER — OXYCODONE HCL 5 MG PO TABS
30.0000 mg | ORAL_TABLET | ORAL | Status: DC | PRN
Start: 2015-05-02 — End: 2015-05-02

## 2015-05-02 MED ORDER — DEXTROSE 5 % IV SOLN
3.0000 g | Freq: Once | INTRAVENOUS | Status: AC
  Administered 2015-05-02: 3 g via INTRAVENOUS
  Filled 2015-05-02: qty 3000

## 2015-05-02 MED ORDER — METHOCARBAMOL 500 MG PO TABS: 500.0000 mg | ORAL_TABLET | Freq: Two times a day (BID) | ORAL | Status: DC

## 2015-05-02 MED ORDER — PHENOL 1.4 % MT LIQD: 1.0000 | OROMUCOSAL | Status: DC | PRN

## 2015-05-02 MED ORDER — OXYCODONE-ACETAMINOPHEN 5-325 MG PO TABS: 1.0000 | ORAL_TABLET | ORAL | Status: DC | PRN

## 2015-05-02 MED ORDER — TRANEXAMIC ACID 1000 MG/10ML IV SOLN
2000.0000 mg | Freq: Once | INTRAVENOUS | Status: AC
  Administered 2015-05-02: 2000 mg via TOPICAL
  Filled 2015-05-02: qty 20

## 2015-05-02 MED ORDER — TRANEXAMIC ACID 650 MG PO TABS
1300.0000 mg | ORAL_TABLET | Freq: Once | ORAL | Status: AC
  Administered 2015-05-02: 1300 mg via ORAL
  Filled 2015-05-02 (×2): qty 2

## 2015-05-02 MED ORDER — SENNOSIDES-DOCUSATE SODIUM 8.6-50 MG PO TABS: 1.0000 | ORAL_TABLET | Freq: Every evening | ORAL | Status: DC | PRN

## 2015-05-02 MED ORDER — PROMETHAZINE HCL 25 MG/ML IJ SOLN: 6.2500 mg | INTRAMUSCULAR | Status: DC | PRN

## 2015-05-02 MED ORDER — DIPHENHYDRAMINE HCL 12.5 MG/5ML PO ELIX: 12.5000 mg | ORAL_SOLUTION | ORAL | Status: DC | PRN

## 2015-05-02 MED ORDER — BUPIVACAINE IN DEXTROSE 0.75-8.25 % IT SOLN
INTRATHECAL | Status: DC | PRN
  Administered 2015-05-02: 2 mL via INTRATHECAL

## 2015-05-02 MED ORDER — METHOCARBAMOL 1000 MG/10ML IJ SOLN: 500.0000 mg | Freq: Four times a day (QID) | INTRAVENOUS | Status: DC | PRN

## 2015-05-02 MED ORDER — MENTHOL 3 MG MT LOZG: 1.0000 | LOZENGE | OROMUCOSAL | Status: DC | PRN

## 2015-05-02 MED ORDER — INDOMETHACIN ER 75 MG PO CPCR
75.0000 mg | ORAL_CAPSULE | Freq: Two times a day (BID) | ORAL | Status: DC
  Administered 2015-05-03 – 2015-05-05 (×5): 75 mg via ORAL
  Filled 2015-05-02 (×8): qty 1

## 2015-05-02 MED ORDER — CEFUROXIME SODIUM 1.5 G IJ SOLR
INTRAMUSCULAR | Status: AC
  Filled 2015-05-02: qty 1.5

## 2015-05-02 MED ORDER — EPHEDRINE SULFATE 50 MG/ML IJ SOLN
INTRAMUSCULAR | Status: DC | PRN
  Administered 2015-05-02 (×2): 10 mg via INTRAVENOUS

## 2015-05-02 MED ORDER — TRANEXAMIC ACID 1000 MG/10ML IV SOLN
1000.0000 mg | INTRAVENOUS | Status: AC
Start: 2015-05-02 — End: 2015-05-02
  Administered 2015-05-02: 1000 mg via INTRAVENOUS
  Filled 2015-05-02: qty 10

## 2015-05-02 MED ORDER — KCL IN DEXTROSE-NACL 20-5-0.45 MEQ/L-%-% IV SOLN
INTRAVENOUS | Status: DC
  Administered 2015-05-03 – 2015-05-04 (×2): via INTRAVENOUS
  Filled 2015-05-02 (×2): qty 1000

## 2015-05-02 MED ORDER — METOCLOPRAMIDE HCL 5 MG/ML IJ SOLN: 5.0000 mg | Freq: Three times a day (TID) | INTRAMUSCULAR | Status: DC | PRN

## 2015-05-02 MED ORDER — ASPIRIN EC 325 MG PO TBEC: 325.0000 mg | DELAYED_RELEASE_TABLET | Freq: Two times a day (BID) | ORAL | Status: DC

## 2015-05-02 SURGICAL SUPPLY — 68 items
BANDAGE ELASTIC 4 VELCRO ST LF (GAUZE/BANDAGES/DRESSINGS) ×3 IMPLANT
BANDAGE ELASTIC 6 VELCRO ST LF (GAUZE/BANDAGES/DRESSINGS) ×3 IMPLANT
BANDAGE ESMARK 6X9 LF (GAUZE/BANDAGES/DRESSINGS) ×1 IMPLANT
BLADE SAG 18X100X1.27 (BLADE) ×3 IMPLANT
BLADE SAW SAG 90X13X1.27 (BLADE) ×3 IMPLANT
BLADE SURG ROTATE 9660 (MISCELLANEOUS) IMPLANT
BNDG ESMARK 6X9 LF (GAUZE/BANDAGES/DRESSINGS) ×3
BOWL SMART MIX CTS (DISPOSABLE) ×3 IMPLANT
COVER BACK TABLE 24X17X13 BIG (DRAPES) IMPLANT
COVER SURGICAL LIGHT HANDLE (MISCELLANEOUS) ×3 IMPLANT
CUFF TOURNIQUET SINGLE 34IN LL (TOURNIQUET CUFF) ×3 IMPLANT
CUFF TOURNIQUET SINGLE 44IN (TOURNIQUET CUFF) IMPLANT
DISC DIAMOND MED (BURR) IMPLANT
DRAPE EXTREMITY T 121X128X90 (DRAPE) ×3 IMPLANT
DRAPE IMP U-DRAPE 54X76 (DRAPES) ×3 IMPLANT
DRAPE U-SHAPE 47X51 STRL (DRAPES) ×3 IMPLANT
DURAPREP 26ML APPLICATOR (WOUND CARE) ×3 IMPLANT
ELECT REM PT RETURN 9FT ADLT (ELECTROSURGICAL) ×3
ELECTRODE REM PT RTRN 9FT ADLT (ELECTROSURGICAL) ×1 IMPLANT
EVACUATOR 1/8 PVC DRAIN (DRAIN) IMPLANT
GAUZE SPONGE 4X4 12PLY STRL (GAUZE/BANDAGES/DRESSINGS) ×6 IMPLANT
GAUZE XEROFORM 1X8 LF (GAUZE/BANDAGES/DRESSINGS) ×3 IMPLANT
GLOVE BIO SURGEON STRL SZ7.5 (GLOVE) ×3 IMPLANT
GLOVE BIO SURGEON STRL SZ8.5 (GLOVE) ×6 IMPLANT
GLOVE BIOGEL PI IND STRL 8 (GLOVE) ×2 IMPLANT
GLOVE BIOGEL PI IND STRL 9 (GLOVE) ×1 IMPLANT
GLOVE BIOGEL PI INDICATOR 8 (GLOVE) ×4
GLOVE BIOGEL PI INDICATOR 9 (GLOVE) ×2
GOWN STRL REUS W/ TWL LRG LVL3 (GOWN DISPOSABLE) ×1 IMPLANT
GOWN STRL REUS W/ TWL XL LVL3 (GOWN DISPOSABLE) ×3 IMPLANT
GOWN STRL REUS W/TWL LRG LVL3 (GOWN DISPOSABLE) ×2
GOWN STRL REUS W/TWL XL LVL3 (GOWN DISPOSABLE) ×6
HANDPIECE INTERPULSE COAX TIP (DISPOSABLE) ×2
HOOD PEEL AWAY FACE SHEILD DIS (HOOD) ×9 IMPLANT
KIT BASIN OR (CUSTOM PROCEDURE TRAY) ×3 IMPLANT
KIT ROOM TURNOVER OR (KITS) ×3 IMPLANT
MANIFOLD NEPTUNE II (INSTRUMENTS) ×3 IMPLANT
NEEDLE SPNL 18GX3.5 QUINCKE PK (NEEDLE) ×3 IMPLANT
NS IRRIG 1000ML POUR BTL (IV SOLUTION) ×3 IMPLANT
PACK TOTAL JOINT (CUSTOM PROCEDURE TRAY) ×3 IMPLANT
PACK UNIVERSAL I (CUSTOM PROCEDURE TRAY) ×3 IMPLANT
PAD ARMBOARD 7.5X6 YLW CONV (MISCELLANEOUS) ×6 IMPLANT
PAD CAST 4YDX4 CTTN HI CHSV (CAST SUPPLIES) ×1 IMPLANT
PADDING CAST ABS 4INX4YD NS (CAST SUPPLIES) ×2
PADDING CAST ABS 6INX4YD NS (CAST SUPPLIES) ×2
PADDING CAST ABS COTTON 4X4 ST (CAST SUPPLIES) ×1 IMPLANT
PADDING CAST ABS COTTON 6X4 NS (CAST SUPPLIES) ×1 IMPLANT
PADDING CAST COTTON 4X4 STRL (CAST SUPPLIES) ×2
PADDING CAST COTTON 6X4 STRL (CAST SUPPLIES) ×3 IMPLANT
PLATE ROT INSERT 10MM SIZE 5 (Plate) ×3 IMPLANT
RASP HELIOCORDIAL MED (MISCELLANEOUS) IMPLANT
SET HNDPC FAN SPRY TIP SCT (DISPOSABLE) ×1 IMPLANT
SPONGE GAUZE 4X4 12PLY STER LF (GAUZE/BANDAGES/DRESSINGS) ×3 IMPLANT
STAPLER VISISTAT 35W (STAPLE) ×3 IMPLANT
SUCTION FRAZIER HANDLE 10FR (MISCELLANEOUS) ×2
SUCTION TUBE FRAZIER 10FR DISP (MISCELLANEOUS) ×1 IMPLANT
SUT VIC AB 0 CT1 27 (SUTURE) ×2
SUT VIC AB 0 CT1 27XBRD ANBCTR (SUTURE) ×1 IMPLANT
SUT VIC AB 1 CTX 36 (SUTURE) ×2
SUT VIC AB 1 CTX36XBRD ANBCTR (SUTURE) ×1 IMPLANT
SUT VIC AB 2-0 CT1 27 (SUTURE) ×2
SUT VIC AB 2-0 CT1 TAPERPNT 27 (SUTURE) ×1 IMPLANT
SYR 50ML LL SCALE MARK (SYRINGE) ×3 IMPLANT
TOWEL OR 17X24 6PK STRL BLUE (TOWEL DISPOSABLE) ×3 IMPLANT
TOWEL OR 17X26 10 PK STRL BLUE (TOWEL DISPOSABLE) ×3 IMPLANT
TRAY FOLEY CATH 14FR (SET/KITS/TRAYS/PACK) IMPLANT
TUBE ANAEROBIC SPECIMEN COL (MISCELLANEOUS) IMPLANT
WATER STERILE IRR 1000ML POUR (IV SOLUTION) ×9 IMPLANT

## 2015-05-02 NOTE — Anesthesia Procedure Notes (Signed)
Spinal Patient location during procedure: OR Start time: 05/02/2015 2:00 PM Staffing Anesthesiologist: Sherrian Divers Performed by: anesthesiologist  Preanesthetic Checklist Completed: patient identified, site marked, surgical consent, pre-op evaluation, timeout performed, IV checked, risks and benefits discussed and monitors and equipment checked Spinal Block Patient position: sitting Prep: Betadine Patient monitoring: heart rate, cardiac monitor and continuous pulse ox Approach: midline Location: L3-4 Injection technique: single-shot Needle Needle type: Pencan  Needle gauge: 24 G Needle length: 10 cm Assessment Sensory level: T6 Additional Notes CSF clear. No paresthesias. Tolerated well. Meaningful verbal contact maintained throughout block placement.

## 2015-05-02 NOTE — Anesthesia Postprocedure Evaluation (Signed)
Anesthesia Post Note  Patient: Charles Peck  Procedure(s) Performed: Procedure(s) (LRB): ONE COMPONENT KNEE REVISION (Right)  Patient location during evaluation: PACU Anesthesia Type: Spinal Level of consciousness: oriented and awake and alert Pain management: pain level controlled Vital Signs Assessment: post-procedure vital signs reviewed and stable Respiratory status: spontaneous breathing, respiratory function stable and patient connected to nasal cannula oxygen Cardiovascular status: blood pressure returned to baseline and stable Postop Assessment: no headache, no backache, spinal receding and patient able to bend at knees Anesthetic complications: no    Last Vitals:  Filed Vitals:   05/02/15 1823 05/02/15 1838  BP: 139/76 148/74  Pulse: 62 58  Temp:    Resp: 13 16    Last Pain: There were no vitals filed for this visit.               Deidrea Gaetz J

## 2015-05-02 NOTE — Discharge Instructions (Signed)

## 2015-05-02 NOTE — Progress Notes (Signed)
Orthopedic Tech Progress Note Patient Details:  Charles Peck May 10, 1970 [id number] Patient exceeds weight limit for overhead frame. Patient ID: Charles Peck, male   DOB: 14-Feb-1971, 45 y.o.   MRN: [id number]   Charles Peck 05/02/2015, 9:09 PM

## 2015-05-02 NOTE — Interval H&P Note (Signed)
History and Physical Interval Note:  05/02/2015 1:23 PM  Charles Peck  has presented today for surgery, with the diagnosis of LOOSE RIGHT TOTAL KNEE  The various methods of treatment have been discussed with the patient and family. After consideration of risks, benefits and other options for treatment, the patient has consented to  Procedure(s): TOTAL KNEE REVISION (Right) as a surgical intervention .  The patient's history has been reviewed, patient examined, no change in status, stable for surgery.  I have reviewed the patient's chart and labs.  Questions were answered to the patient's satisfaction.     Nestor Lewandowsky

## 2015-05-02 NOTE — Anesthesia Preprocedure Evaluation (Addendum)
Anesthesia Evaluation  Patient identified by MRN, date of birth, ID band Patient awake    Reviewed: Allergy & Precautions, NPO status , Patient's Chart, lab work & pertinent test results  Airway Mallampati: II  TM Distance: >3 FB Neck ROM: Full    Dental no notable dental hx. (+) Dental Advisory Given, Missing,    Pulmonary former smoker,    Pulmonary exam normal breath sounds clear to auscultation       Cardiovascular Exercise Tolerance: Good Normal cardiovascular exam Rhythm:Regular Rate:Normal     Neuro/Psych  Headaches,    GI/Hepatic   Endo/Other    Renal/GU      Musculoskeletal  (+) Arthritis , Rheumatoid disorders,    Abdominal   Peds  Hematology   Anesthesia Other Findings   Reproductive/Obstetrics                          Anesthesia Physical Anesthesia Plan  ASA: III  Anesthesia Plan: MAC and Spinal   Post-op Pain Management:    Induction:   Airway Management Planned: Nasal Cannula  Additional Equipment: None  Intra-op Plan:   Post-operative Plan: Extubation in OR  Informed Consent: I have reviewed the patients History and Physical, chart, labs and discussed the procedure including the risks, benefits and alternatives for the proposed anesthesia with the patient or authorized representative who has indicated his/her understanding and acceptance.   Dental advisory given  Plan Discussed with: CRNA, Anesthesiologist and Surgeon  Anesthesia Plan Comments: (Discussed risks and benefits of and differences between spinal and general. Discussed risks of spinal including headache, backache, failure, bleeding and hematoma, infection, and nerve damage. Patient consents to spinal. Questions answered. Coagulation studies and platelet count acceptable.)       Anesthesia Quick Evaluation

## 2015-05-02 NOTE — H&P (View-Only) (Signed)
TOTAL KNEE REVISION ADMISSION H&P  Patient is being admitted for right revision total knee arthroplasty.  Subjective:  Chief Complaint:right knee pain.  HPI: Charles Peck, 45 y.o. male, has a history of pain and functional disability in the right knee(s) due to failed previous arthroplasty and patient has failed non-surgical conservative treatments for greater than 12 weeks to include NSAID's and/or analgesics, use of assistive devices and activity modification. The indications for the revision of the total knee arthroplasty are loosening of one or more components. Onset of symptoms was abrupt starting 1 years ago with rapidlly worsening course since that time.  Prior procedures on the right knee(s) include arthroplasty.  Patient currently rates pain in the right knee(s) at 10 out of 10 with activity. There is night pain, worsening of pain with activity and weight bearing, pain that interferes with activities of daily living, pain with passive range of motion and joint swelling.  Patient has evidence of prosthetic loosening by imaging studies. This condition presents safety issues increasing the risk of falls.   There is no current active infection.  Patient Active Problem List   Diagnosis Date Noted  . Osteoarthritis of right knee 01/15/2012  . Nonspecific (abnormal) findings on radiological and other examination of body structure 06/28/2009  . CT, CHEST, ABNORMAL 06/28/2009  . DISC DISEASE, LUMBAR 06/27/2009   Past Medical History  Diagnosis Date  . Degenerative joint disease   . Testicle swelling 03-29-13    exploratory surgery planned of right testicle  . Headache     not in a while    . RA (rheumatoid arthritis) [facility])     Past Surgical History  Procedure Laterality Date  . Cervical fusion  2005  . Lumbar disc surgery  2009  . Hip arthroplasty  2011    bilateral 1 month  . Bunionectomy  2012    also foot surgery to both feet  . Total knee arthroplasty  01/12/2012   Procedure: TOTAL KNEE ARTHROPLASTY;  Surgeon: Nestor Lewandowsky, MD;  Location: [facility] OR;  Service: Orthopedics;  Laterality: Right;  . Cystoscopy N/A 04/01/2013    Procedure: CYSTOSCOPY FLEXIBLE EXCISIONAL BIOPSY ;  Surgeon: Antony Haste, MD;  Location: [facility] ORS;  Service: Urology;  Laterality: N/A;  EXCISIONAL BIOPSY   . Testicular exploration Right 04/01/2013    Procedure: RIGHT INGUINAL EXPLORATION;  Surgeon: Antony Haste, MD;  Location: [facility] ORS;  Service: Urology;  Laterality: Right;  RIGHT INGUINAL EXPLORATION    No prescriptions prior to admission   No Known Allergies  Social History  Substance Use Topics  . Smoking status: Former Smoker    Quit date: 03/17/1996  . Smokeless tobacco: Never Used     Comment: was a light, social smoker  . Alcohol Use: No    No family history on file.    Review of Systems  Constitutional: Negative.   HENT: Negative.   Eyes: Negative.   Respiratory: Negative.   Cardiovascular: Positive for leg swelling.  Gastrointestinal: Negative.   Genitourinary: Negative.   Musculoskeletal: Positive for myalgias and joint pain.       RA  Skin: Negative.   Neurological: Negative.   Endo/Heme/Allergies: Negative.   Psychiatric/Behavioral: Negative.      Objective:  Physical Exam  Constitutional: He is oriented to person, place, and time. He appears well-developed and well-nourished.  HENT:  Head: Normocephalic and atraumatic.  Eyes: Pupils are equal, round, and reactive to light.  Neck: Normal range of motion. Neck supple.  Cardiovascular: Intact distal pulses.   Respiratory: Effort normal.  Musculoskeletal: He exhibits tenderness.  The right knee hasn't obvious 10 valgus deformity tender along the lateral distal aspect of the femur no palpable effusion.  Range of motion is 5-85 collateral ligaments reveal laxity to valgus stress.  One plus with a good endpoint, but also exacerbates his pain.  Varus stress does not cause any discomfort.   He is neurovascularly intact distally.  His height is 6 feet 4 inches and he weighs 320+ pounds.  Neurological: He is alert and oriented to person, place, and time.  Skin: Skin is warm and dry.  Psychiatric: He has a normal mood and affect. His behavior is normal. Judgment and thought content normal.    Vital signs in last 24 hours: Temp:  [98.9 F (37.2 C)] 98.9 F (37.2 C) (02/02 0827) Pulse Rate:  [66] 66 (02/02 0827) Resp:  [20] 20 (02/02 0827) BP: (120)/(82) 120/82 mmHg (02/02 0827) SpO2:  [95 %] 95 % (02/02 0827) Weight:  [154.495 kg (340 lb 9.6 oz)] 154.495 kg (340 lb 9.6 oz) (02/02 0827)  Labs:  Estimated body mass index is 38.18 kg/(m^2) as calculated from the following:   Height as of 03/29/13: 6\' 4"  (1.93 m).   Weight as of 03/29/13: 142.203 kg (313 lb 8 oz).  Imaging Review Plain radiographs demonstrate the patient's femoral component has angulated into a valgus deformity of 10 and there is been partial collapse of the lateral femoral condyle.  Tibial component appears to be well placed and well fixed as does the patellar component.  Assessment/Plan:  End stage arthritis, right knee(s) with failed previous arthroplasty.   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  The patient history, physical examination, clinical judgment of the provider and imaging studies are consistent with end stage degenerative joint disease of the right knee(s), previous total knee arthroplasty. Revision total knee arthroplasty is deemed medically necessary. The treatment options including medical management, injection therapy, arthroscopy and revision arthroplasty were discussed at length. The risks and benefits of revision total knee arthroplasty were presented and reviewed. The risks due to aseptic loosening, infection, stiffness, patella tracking problems, thromboembolic complications and other imponderables  were discussed. The patient acknowledged the explanation, agreed to proceed with the plan and consent was signed. Patient is being admitted for inpatient treatment for surgery, pain control, PT, OT, prophylactic antibiotics, VTE prophylaxis, progressive ambulation and ADL's and discharge planning.The patient is planning to be discharged home with home health services

## 2015-05-02 NOTE — Op Note (Signed)
PATIENT ID:      Charles Peck  MRN:     [id number] DOB/AGE:    [birth year] / 46 y.o.       OPERATIVE REPORT    DATE OF PROCEDURE:  05/02/2015       PREOPERATIVE DIAGNOSIS:   LOOSE RIGHT TOTAL KNEE      Estimated body mass index is 40.92 kg/(m^2) as calculated from the following:   Height as of this encounter: 6\' 4"  (1.93 m).   Weight as of this encounter: 152.409 kg (336 lb).                                                        POSTOPERATIVE DIAGNOSIS:   ARTHROFIBROSIS                                                                      PROCEDURE:  Procedure(s): ONE COMPONENT KNEE REVISION Using Depuy Sigma RP implants 20mm Sigma RP bearing.     SURGEON: 9m J    ASSISTANT:   Gean Birchwood. Tomi Likens   (Present and scrubbed throughout the case, critical for assistance with exposure, retraction, instrumentation, and closure.)         ANESTHESIA: Spinal, Exparel  EBL: 300  FLUID REPLACEMENT: 1600 crystalloid TOURNIQUET TIME: 0 min  DRAINS: none  Tranexamic Acid: 1gm iv, 2gm topical  COMPLICATIONS:  None        INDICATIONS FOR PROCEDURE: The patient right total knee arthroplasty in October 2013 did well until by 6 months ago with progressive loss of motion and apparent valgus deformity. When I saw him in the office 2 weeks ago he had very limited motion from 5-60 and on x-ray appeared to have a valgus deformity with probable loosening of the femoral component. Because of these findings we discussed open revision of at least the femoral component and tibial bearing and because of this pain and disability the patient elected to proceed with surgery. Preoperatively had no fevers or chills or wound problems and there was no palpable effusion just thick brawny scar tissue.   Risks and benefits of surgery have been discussed, questions answered.   DESCRIPTION OF PROCEDURE: The patient identified by armband, received  IV antibiotics, in the holding area at [facility]. Patient  taken to the operating room, appropriate anesthetic monitors were attached, and general endotracheal anesthesia induced with the patient in supine position. Tourniquet applied high to the operative thigh. Lateral post and foot positioner applied to the table, the lower extremity was then prepped and draped in usual sterile fashion from the ankle to the tourniquet. Time-out procedure was performed.  We began the operation with the knee flexed 100 degrees, using a sure foot. The tourniquet was left down we re-created the anterior incision and excise an area of keloid scar that was directly over the patella. 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 A medial parapatellar  arthrotomy was accomplished and large amounts of scar tissue were removed. Peripherally around of the femur and the tibia there was optic bone that was resected using osteotomes Rogers and the electrocautery to remove scar tissue. We probably spent close to an hour just getting the scar tissue out loosening of the knee so he could flex to 90 and we could evert the patella once this was accomplished to remove a lot of heterotopic bone from around the patella and also from the retinacula. Tissue and fluid was sent for Gram stain and culture although the joint fluid appeared to be normal in appearance and clear no evidence of purulence. Once all scar tissue was removed were able to the slap hammer on the femoral component it was not loose patellar component was not loose and the tibial component was also not loose. We thoroughly irrigated out normal saline solution and also applied trans-Amick acid topically to the tissues to help prevent bleeding. At this time a new 10 mm bearing was inserted the patella was noted to track with no thumb pressure and range of motion which had been 560 preoperatively  was now 0-125 degrees. We irrigated out with pulse lavage one more time and closed in layers with running #1 Vicryl suture in the parapatellar arthrotomy subcutaneously we used 0 and 2-0 undyed Vicryl suture running and and subcuticular 3-0 running Vicryl suture and the skin followed by dressing of Aquacel and an Ace wrap. Postoperatively we will not use any CPM. He will be given Indocin postoperatively to help prevent heterotopic bone. Gean Birchwood J 05/02/2015, 4:02 PM

## 2015-05-02 NOTE — Transfer of Care (Signed)
Immediate Anesthesia Transfer of Care Note  Patient: Charles Peck  Procedure(s) Performed: Procedure(s): ONE COMPONENT KNEE REVISION (Right)  Patient Location: PACU  Anesthesia Type:MAC and Spinal  Level of Consciousness: alert , oriented and patient cooperative  Airway & Oxygen Therapy: Patient Spontanous Breathing  Post-op Assessment: Report given to RN, Post -op Vital signs reviewed and stable and Patient moving all extremities  Post vital signs: Reviewed and stable  Last Vitals:  Filed Vitals:   05/02/15 1327  BP: 157/54  Pulse: 66  Temp: 36.9 C  Resp: 18    Complications: No apparent anesthesia complications

## 2015-05-03 ENCOUNTER — Encounter (HOSPITAL_COMMUNITY): Payer: Self-pay | Admitting: General Practice

## 2015-05-03 LAB — CBC
HEMATOCRIT: 34.9 % — AB (ref 39.0–52.0)
HEMOGLOBIN: 11.3 g/dL — AB (ref 13.0–17.0)
MCH: 26.5 pg (ref 26.0–34.0)
MCHC: 32.4 g/dL (ref 30.0–36.0)
MCV: 81.7 fL (ref 78.0–100.0)
Platelets: 175 10*3/uL (ref 150–400)
RBC: 4.27 MIL/uL (ref 4.22–5.81)
RDW: 14.6 % (ref 11.5–15.5)
WBC: 9.1 10*3/uL (ref 4.0–10.5)

## 2015-05-03 LAB — BASIC METABOLIC PANEL
Anion gap: 11 (ref 5–15)
BUN: 7 mg/dL (ref 6–20)
CHLORIDE: 103 mmol/L (ref 101–111)
CO2: 24 mmol/L (ref 22–32)
Calcium: 8.9 mg/dL (ref 8.9–10.3)
Creatinine, Ser: 0.79 mg/dL (ref 0.61–1.24)
GFR calc non Af Amer: >60 mL/min (ref >60)
Glucose, Bld: 116 mg/dL — ABNORMAL HIGH (ref 65–99)
POTASSIUM: 4.1 mmol/L (ref 3.5–5.1)
SODIUM: 138 mmol/L (ref 135–145)

## 2015-05-03 MED ORDER — OXYCODONE HCL 5 MG PO TABS
40.0000 mg | ORAL_TABLET | ORAL | Status: DC | PRN
  Administered 2015-05-03 – 2015-05-05 (×13): 40 mg via ORAL
  Filled 2015-05-03 (×13): qty 8

## 2015-05-03 NOTE — Clinical Social Work Note (Signed)
CSW received referral for SNF.  Case discussed with case manager, and plan is to discharge home with home health.  CSW to sign off please re-consult if social work needs arise.  Mahlon Gabrielle R. Yarisa Lynam, MSW, LCSWA [phone]  

## 2015-05-03 NOTE — Progress Notes (Signed)
Patient ID: Charles Peck, male   DOB: 1971-01-25, 45 y.o.   MRN: [id number] PATIENT ID: Charles Peck  MRN: [id number]  DOB/AGE:  1971-02-11 / 45 y.o.  1 Day Post-Op Procedure(s) (LRB): ONE COMPONENT KNEE REVISION (Right)    PROGRESS NOTE Subjective: Patient is alert, oriented, no Nausea, no Vomiting, yes passing gas. Taking PO well. Denies SOB, Chest or Calf Pain. Using Incentive Spirometer, PAS in place. Ambulate WBAT. Patient reports pain as 10/10 .    Objective: Vital signs in last 24 hours: Filed Vitals:   05/02/15 1922 05/02/15 1946 05/03/15 0043 05/03/15 0342  BP: 139/77 134/78 143/74 120/69  Pulse:  64 70 72  Temp:  98.6 F (37 C) 101 F (38.3 C) 100 F (37.8 C)  TempSrc:  Oral Oral Oral  Resp:  20 18 16   Height:      Weight:      SpO2:  97% 97% 97%      Intake/Output from previous day: I/O last 3 completed shifts: In: 1250 [I.V.:1250] Out: 450 [Urine:450]   Intake/Output this shift:     LABORATORY DATA: No results for input(s): WBC, HGB, HCT, PLT, NA, K, CL, CO2, BUN, CREATININE, GLUCOSE, GLUCAP, INR, CALCIUM in the last 72 hours.  Invalid input(s): PT, 2  Examination: Neurologically intact ABD soft Neurovascular intact Sensation intact distally Intact pulses distally Dorsiflexion/Plantar flexion intact Incision: dressing C/D/I No cellulitis present Compartment soft}  Assessment:   1 Day Post-Op Procedure(s) (LRB): ONE COMPONENT KNEE REVISION (Right) ADDITIONAL DIAGNOSIS: Expected Acute Blood Loss Anemia, Chronic LBP and Pain Mgt  Plan: PT/OT WBAT, CPM 5/hrs day until ROM 0-90 degrees, then D/C CPM DVT Prophylaxis:  SCDx72hrs, ASA 325 mg BID x 2 weeks DISCHARGE PLAN: Home DISCHARGE NEEDS: HHPT, Walker and 3-in-1 comode seat     Charles Peck 05/03/2015, 7:08 AM

## 2015-05-03 NOTE — Progress Notes (Signed)
Physical Therapy Treatment Patient Details Name: Charles Peck MRN: [id number] DOB: 04-Feb-1971 Today's Date: 05/03/2015    History of Present Illness Pt admitted for redo R TKA with initial one performed 2013. PMHx: bil THA, DDD    PT Comments    Pt continues to have difficulty with knee flexion, standing and sitting from surfaces with confirmation that all surfaces at home are elevated or have rail to assist pt with transfers. Pt and wife remain determined for D/C home and expressed concern over pt function to them. Pt was able to ambulate increased distance with pt diaphoretic from exertion with limited gait. Pt requires assist for all transfers and mobility. Encouraged HEP throughout day. Will continue to follow.   Follow Up Recommendations  Home health PT;Supervision for mobility/OOB     Equipment Recommendations  None recommended by PT    Recommendations for Other Services OT consult     Precautions / Restrictions Precautions Precautions: Knee;Fall Restrictions RLE Weight Bearing: Weight bearing as tolerated    Mobility  Bed Mobility Overal bed mobility: Needs Assistance Bed Mobility: Sit to Supine      Sit to supine: Mod assist   General bed mobility comments: assist to bring bil LEs onto surface with cues and guarding for upper body  Transfers Overall transfer level: Needs assistance   Transfers: Sit to/from Stand Sit to Stand: From elevated surface;Min assist;+2 physical assistance         General transfer comment: chair height elevated to arm rests with cues for hand and foot placement with left knee grossly 90 degrees flexion, cues and assist for anterior translation with increased time to achieve upright  Ambulation/Gait Ambulation/Gait assistance: Min assist;+2 safety/equipment Ambulation Distance (Feet): 55 Feet Assistive device: Rolling walker (2 wheeled) Gait Pattern/deviations: Step-to pattern;Decreased stride length;Trunk flexed;Wide base of  support;Decreased dorsiflexion - right;Decreased stance time - right   Gait velocity interpretation: Below normal speed for age/gender General Gait Details: pt with wide BOS, maintained flexion, cues for increased swing and stepping RLE as well as RW use and posture, limited by fatigue with chair follow for safety   Stairs            Wheelchair Mobility    Modified Rankin (Stroke Patients Only)       Balance Overall balance assessment: Needs assistance Sitting-balance support: Feet supported Sitting balance-Leahy Scale: Fair       Standing balance-Leahy Scale: Poor                      Cognition Arousal/Alertness: Awake/alert Behavior During Therapy: WFL for tasks assessed/performed Overall Cognitive Status: Within Functional Limits for tasks assessed                      Exercises Total Joint Exercises Quad Sets: AROM;Right;5 reps;Supine Short Arc Quad: AAROM;Right;10 reps;Seated Heel Slides: AAROM;Right;Supine;10 reps Hip ABduction/ADduction: AAROM;Right;10 reps;Supine Straight Leg Raises: AAROM;Right;10 reps;Supine Goniometric ROM: 15-45    General Comments        Pertinent Vitals/Pain Pain Assessment: 0-10 Pain Score: 6  Pain Location: right knee Pain Descriptors / Indicators: Aching Pain Intervention(s): Limited activity within patient's tolerance;Monitored during session;Premedicated before session;Repositioned    Home Living Family/patient expects to be discharged to:: Private residence Living Arrangements: Spouse/significant other Available Help at Discharge: Family;Available 24 hours/day Type of Home: Apartment Home Access: Stairs to enter Entrance Stairs-Rails: Can reach both Home Layout: One level Home Equipment: Walker - 2 wheels;Cane - single point;Bedside commode;Adaptive equipment  Prior Function Level of Independence: Independent with assistive device(s)      Comments: was using cane again PTA due to pain and  fatigue, difficulty with walking and moving. requires high bed at home with step stool onto it   PT Goals (current goals can now be found in the care plan section) Acute Rehab PT Goals Patient Stated Goal: be able to walk PT Goal Formulation: With patient/family Time For Goal Achievement: 05/10/15 Potential to Achieve Goals: Fair Progress towards PT goals: Progressing toward goals    Frequency  7X/week    PT Plan Current plan remains appropriate    Co-evaluation PT/OT/SLP Co-Evaluation/Treatment: Yes Reason for Co-Treatment: For patient/therapist safety PT goals addressed during session: Mobility/safety with mobility;Proper use of DME;Strengthening/ROM       End of Session Equipment Utilized During Treatment: Gait belt Activity Tolerance: Patient tolerated treatment well;Patient limited by fatigue;Patient limited by pain Patient left: in bed;with call bell/phone within reach;with family/visitor present     Time: 4970-2637 PT Time Calculation (min) (ACUTE ONLY): 32 min  Charges:  $Gait Training: 8-22 mins $Therapeutic Activity: 8-22 mins                    G Codes:      Delorse Lek 02-Jun-2015, 12:52 PM Delaney Meigs, PT [phone]

## 2015-05-03 NOTE — Progress Notes (Signed)
Occupational Therapy Evaluation Patient Details Name: Charles Peck MRN: [id number] DOB: Nov 27, 1970 Today's Date: 05/03/2015    History of Present Illness Pt admitted for redo R TKA with initial one performed 2013. PMHx: bil THA, DDD   Clinical Impression   PTA, pt mod I with mobility and required occasional min a with LB ADL. Works in [employer]. Pt with significant functional decline. Will follow acutely and educate on AE and compensatory techniques for LB ADL. Will further assess DME for tub. Recommend follow up with HHOT.     Follow Up Recommendations  Home health OT;Supervision/Assistance - 24 hour    Equipment Recommendations  None recommended by OT    Recommendations for Other Services       Precautions / Restrictions Precautions Precautions: Knee;Fall Restrictions RLE Weight Bearing: Weight bearing as tolerated      Mobility Bed Mobility Overal bed mobility: +2 for physical assistance;Needs Assistance Bed Mobility: Sit to Supine     Supine to sit: Min assist Sit to supine: Mod assist;+2 for physical assistance   General bed mobility comments: assist to bring bil LEs onto surface with cues and guarding for upper body  Transfers Overall transfer level: Needs assistance Equipment used: Rolling walker (2 wheeled) Transfers: Sit to/from Stand Sit to Stand: +2 physical assistance;Min assist         General transfer comment: chair height elevated to arm rests with cues for hand and foot placement with left knee grossly 90 degrees flexion, cues and assist for anterior translation with increased time to achieve upright    Balance Overall balance assessment: Needs assistance Sitting-balance support: Feet supported Sitting balance-Leahy Scale: Fair       Standing balance-Leahy Scale: Poor                              ADL Overall ADL's : Needs assistance/impaired     Grooming: Set up;Sitting   Upper Body Bathing:  Set up;Sitting   Lower Body Bathing: Moderate assistance;Sit to/from stand   Upper Body Dressing : Set up;Sitting   Lower Body Dressing: Maximal assistance;Sit to/from stand   Toilet Transfer: Moderate assistance;+2 for physical assistance;Stand-pivot (simulated)   Toileting- Clothing Manipulation and Hygiene: Moderate assistance       Functional mobility during ADLs: Moderate assistance;+2 for physical assistance;Rolling walker General ADL Comments: Required assist for LB dresisng PTA. Is interested in assessing use of AE. Described the technique he uses for tub transfer. May benefit from DME. Need to further assess.     Vision     Perception     Praxis      Pertinent Vitals/Pain Pain Assessment: 0-10 Pain Score: 6  Pain Location: R knee Pain Descriptors / Indicators: Aching Pain Intervention(s): Limited activity within patient's tolerance     Hand Dominance     Extremity/Trunk Assessment Upper Extremity Assessment Upper Extremity Assessment: Overall WFL for tasks assessed   Lower Extremity Assessment Lower Extremity Assessment: Defer to PT evaluation RLE Deficits / Details: pt with limited hip and knee ROM at baseline with knee flexion grossly 20 degrees LLE Deficits / Details: limited hip and knee ROM with knee flexion grossly 40 degrees supine   Cervical / Trunk Assessment Cervical / Trunk Assessment: Kyphotic   Communication Communication Communication: No difficulties   Cognition Arousal/Alertness: Awake/alert Behavior During Therapy: WFL for tasks assessed/performed Overall Cognitive Status: Within Functional Limits for tasks assessed  General Comments       Exercises       Shoulder Instructions      Home Living Family/patient expects to be discharged to:: Private residence Living Arrangements: Spouse/significant other Available Help at Discharge: Family;Available 24 hours/day Type of Home: Apartment Home Access:  Stairs to enter Entrance Stairs-Number of Steps: 14 Entrance Stairs-Rails: Can reach both Home Layout: One level     Bathroom Shower/Tub: Tub/shower unit Shower/tub characteristics: Engineer, building services: Standard Bathroom Accessibility: Yes How Accessible: Accessible via walker Home Equipment: Walker - 2 wheels;Cane - single point;Bedside commode;Adaptive equipment Adaptive Equipment: Long-handled shoe horn        Prior Functioning/Environment Level of Independence: Independent with assistive device(s)        Comments: was using RW again PTA due to pain and fatigue, difficulty with walking and moving. requires high bed at home with step stool onto it    OT Diagnosis: Generalized weakness;Acute pain   OT Problem List: Decreased strength;Decreased range of motion;Impaired balance (sitting and/or standing);Decreased safety awareness;Decreased knowledge of use of DME or AE;Decreased knowledge of precautions;Obesity;Pain   OT Treatment/Interventions: Self-care/ADL training;DME and/or AE instruction;Therapeutic activities;Patient/family education;Balance training    OT Goals(Current goals can be found in the care plan section) Acute Rehab OT Goals Patient Stated Goal: back to "normal" OT Goal Formulation: With patient Time For Goal Achievement: 05/17/15 Potential to Achieve Goals: Good ADL Goals Pt Will Perform Lower Body Bathing: with min guard assist;with adaptive equipment;sit to/from stand;with caregiver independent in assisting Pt Will Perform Lower Body Dressing: with min guard assist;with caregiver independent in assisting;with adaptive equipment;sit to/from stand Pt Will Transfer to Toilet: with min guard assist;bedside commode;ambulating (with caregiver assisting) Pt Will Perform Toileting - Clothing Manipulation and hygiene: with set-up;with supervision;sitting/lateral leans  OT Frequency: Min 2X/week   Barriers to D/C: Other (comment) (lives on second floor apt)           Co-evaluation PT/OT/SLP Co-Evaluation/Treatment: Yes Reason for Co-Treatment: For patient/therapist safety PT goals addressed during session: Mobility/safety with mobility;Proper use of DME;Strengthening/ROM OT goals addressed during session: ADL's and self-care      End of Session Equipment Utilized During Treatment: Gait belt;Rolling walker Nurse Communication: Mobility status  Activity Tolerance: Patient tolerated treatment well Patient left: in bed;with call bell/phone within reach;with family/visitor present   Time: 7001-7494 OT Time Calculation (min): 31 min Charges:  OT General Charges $OT Visit: 1 Procedure OT Evaluation $OT Eval Moderate Complexity: 1 Procedure G-Codes:    Celina Shiley,HILLARY May 26, 2015, 1:06 PM   Luisa Dago, OTR/L  [phone] 26-May-2015

## 2015-05-03 NOTE — Evaluation (Signed)
Physical Therapy Evaluation Patient Details Name: Charles Peck MRN: [id number] DOB: 1970-07-16 Today's Date: 05/03/2015   History of Present Illness  Pt admitted for redo R TKA with initial one performed 2013. PMHx: bil THA, DDD  Clinical Impression  Pt pleasant but having great difficulty with all mobility due to baseline limited function of bil knees and hips. Pt with decreased strength, ROM, transfers, gait, mobility and function who will benefit from acute therapy to maximize mobility, function, gait and independence to decrease burden of care. Pt wants to return home but with current level of assist may need to consider ST-SNF if pt unable to achieve supervision level to complete transfers, gait and stairs. Pt educated for precaution, positioning, HEP, transfers and gait with HEP handout provided. Pt positioned in chair with elevation on linens and RN aware.     Follow Up Recommendations Home health PT    Equipment Recommendations  None recommended by PT    Recommendations for Other Services OT consult     Precautions / Restrictions Precautions Precautions: Knee;Fall Restrictions RLE Weight Bearing: Weight bearing as tolerated      Mobility  Bed Mobility Overal bed mobility: Needs Assistance Bed Mobility: Supine to Sit     Supine to sit: Min assist     General bed mobility comments: cues for sequence with assist to move RLE to EOB, use of rail and cues for sequence, increased time  Transfers Overall transfer level: Needs assistance   Transfers: Sit to/from Stand Sit to Stand: From elevated surface;Mod assist;+2 physical assistance         General transfer comment: bed height grossly 3' before pt able to clear surface, right knee blocked due to weak quad and buckling PTA. Max cues for hand placement, anterior translation and sequence  Ambulation/Gait Ambulation/Gait assistance: Min assist;+2 safety/equipment Ambulation Distance (Feet): 8 Feet Assistive device:  Rolling walker (2 wheeled) Gait Pattern/deviations: Step-to pattern;Trunk flexed   Gait velocity interpretation: Below normal speed for age/gender General Gait Details: cues for posture, position in RW, sequence, limited by fatigue with chair to follow  Stairs            Wheelchair Mobility    Modified Rankin (Stroke Patients Only)       Balance Overall balance assessment: Needs assistance Sitting-balance support: Feet supported Sitting balance-Leahy Scale: Fair       Standing balance-Leahy Scale: Poor                               Pertinent Vitals/Pain Pain Assessment: 0-10 Pain Score: 8  Pain Location: right knee Pain Descriptors / Indicators: Aching Pain Intervention(s): Limited activity within patient's tolerance;Monitored during session;Repositioned;Patient requesting pain meds-RN notified    Home Living Family/patient expects to be discharged to:: Private residence Living Arrangements: Spouse/significant other Available Help at Discharge: Family;Available 24 hours/day Type of Home: Apartment Home Access: Stairs to enter Entrance Stairs-Rails: Can reach both Entrance Stairs-Number of Steps: 14 Home Layout: One level Home Equipment: Walker - 2 wheels;Cane - single point;Bedside commode;Adaptive equipment      Prior Function Level of Independence: Independent with assistive device(s)         Comments: was using RW again PTA due to pain and fatigue, difficulty with walking and moving. requires high bed at home with step stool onto it     Hand Dominance        Extremity/Trunk Assessment   Upper Extremity Assessment: Defer to OT  evaluation           Lower Extremity Assessment: Generalized weakness;RLE deficits/detail;LLE deficits/detail RLE Deficits / Details: pt with limited hip and knee ROM at baseline with knee flexion grossly 20 degrees LLE Deficits / Details: limited hip and knee ROM with knee flexion grossly 40 degrees  supine  Cervical / Trunk Assessment: Kyphotic  Communication   Communication: No difficulties  Cognition Arousal/Alertness: Awake/alert Behavior During Therapy: WFL for tasks assessed/performed Overall Cognitive Status: Within Functional Limits for tasks assessed                      General Comments      Exercises Total Joint Exercises Quad Sets: AROM;Right;5 reps;Supine Heel Slides: AAROM;Right;5 reps;Supine      Assessment/Plan    PT Assessment Patient needs continued PT services  PT Diagnosis Difficulty walking;Acute pain;Generalized weakness   PT Problem List Decreased strength;Decreased range of motion;Decreased activity tolerance;Decreased balance;Decreased mobility;Pain;Decreased knowledge of use of DME  PT Treatment Interventions DME instruction;Gait training;Stair training;Functional mobility training;Therapeutic activities;Therapeutic exercise;Patient/family education   PT Goals (Current goals can be found in the Care Plan section) Acute Rehab PT Goals Patient Stated Goal: be able to walk PT Goal Formulation: With patient/family Time For Goal Achievement: 05/10/15 Potential to Achieve Goals: Fair    Frequency 7X/week   Barriers to discharge Decreased caregiver support      Co-evaluation               End of Session Equipment Utilized During Treatment: Gait belt Activity Tolerance: Patient tolerated treatment well;Patient limited by fatigue;Patient limited by pain Patient left: in chair;with call bell/phone within reach;with family/visitor present Nurse Communication: Mobility status;Weight bearing status         Time: 2778-2423 PT Time Calculation (min) (ACUTE ONLY): 24 min   Charges:   PT Evaluation $PT Eval Moderate Complexity: 1 Procedure PT Treatments $Therapeutic Activity: 8-22 mins   PT G Codes:        Delorse Lek 05/03/2015, 9:11 AM Delaney Meigs, PT [phone]

## 2015-05-03 NOTE — Progress Notes (Signed)
Utilization review completed.  

## 2015-05-03 NOTE — Care Management Note (Addendum)
Case Management Note  Patient Details  Name: Charles Peck MRN: [id number] Date of Birth: 07/28/1970  Subjective/Objective:           S/p right knee one compartment revision         Action/Plan: Set up with [facility] for HHRN, HHPT and HHOT by MD office. Spoke with patient and his wife, no change in discharge plan. Patient stated that he has a rolling walker and 3N1 from previous surgery.  Patient's wife stated that she will be assisting patient after discharge. No other discharge needs identified.   Expected Discharge Date:                  Expected Discharge Plan:  Home w Home Health Services  In-House Referral:  NA  Discharge planning Services  CM Consult  Post Acute Care Choice:  Durable Medical Equipment, Home Health Choice offered to:  Patient  DME Arranged:  CPM DME Agency:  [employer]  HH Arranged:  RN, PT, OT HH Agency:  [facility]  Status of Service:  Completed, signed off  Medicare Important Message Given:    Date Medicare IM Given:    Medicare IM give by:    Date Additional Medicare IM Given:    Additional Medicare Important Message give by:     If discussed at Long Length of Stay Meetings, dates discussed:    Additional Comments:  Monica Becton, RN 05/03/2015, 3:46 PM

## 2015-05-04 LAB — CBC
HCT: 35.3 % — ABNORMAL LOW (ref 39.0–52.0)
Hemoglobin: 11 g/dL — ABNORMAL LOW (ref 13.0–17.0)
MCH: 25.7 pg — ABNORMAL LOW (ref 26.0–34.0)
MCHC: 31.2 g/dL (ref 30.0–36.0)
MCV: 82.5 fL (ref 78.0–100.0)
PLATELETS: 168 10*3/uL (ref 150–400)
RBC: 4.28 MIL/uL (ref 4.22–5.81)
RDW: 14.3 % (ref 11.5–15.5)
WBC: 9.8 10*3/uL (ref 4.0–10.5)

## 2015-05-04 NOTE — Discharge Summary (Signed)
Patient ID: KASE SHUGHART MRN: [id number] DOB/AGE: 1970/07/22 45 y.o.  Admit date: 05/02/2015 Discharge date: 05/04/2015  Admission Diagnoses:  Active Problems:   Arthrofibrosis of knee joint   Discharge Diagnoses:  Same  Past Medical History  Diagnosis Date  . Degenerative joint disease   . Testicle swelling 03-29-13    exploratory surgery planned of right testicle  . Headache     not in a while    . RA (rheumatoid arthritis) (HCC)   . UTI (urinary tract infection)     Surgeries: Procedure(s): ONE COMPONENT KNEE REVISION on 05/02/2015   Consultants:    Discharged Condition: Improved  Hospital Course: DEANGLEO PASSAGE is an 45 y.o. male who was admitted 05/02/2015 for operative treatment of<principal problem not specified>. Patient has severe unremitting pain that affects sleep, daily activities, and work/hobbies. After pre-op clearance the patient was taken to the operating room on 05/02/2015 and underwent  Procedure(s): ONE COMPONENT KNEE REVISION.    Patient was given perioperative antibiotics: Anti-infectives    Start     Dose/Rate Route Frequency Ordered Stop   05/02/15 2015  ciprofloxacin (CIPRO) tablet 500 mg     500 mg Oral 2 times daily 05/02/15 2005     05/02/15 1508  cefUROXime (ZINACEF) injection  Status:  Discontinued       As needed 05/02/15 1508 05/02/15 1618   05/02/15 1345  ceFAZolin (ANCEF) 3 g in dextrose 5 % 50 mL IVPB     3 g 160 mL/hr over 30 Minutes Intravenous  Once 05/02/15 1337 05/02/15 1345   05/02/15 1345  ceFAZolin (ANCEF) 3 g in dextrose 5 % 50 mL IVPB  Status:  Discontinued     3 g 130 mL/hr over 30 Minutes Intravenous  Once 05/02/15 1337 05/02/15 1338       Patient was given sequential compression devices, early ambulation, and chemoprophylaxis to prevent DVT.  Patient benefited maximally from hospital stay and there were no complications.    Recent vital signs: Patient Vitals for the past 24 hrs:  BP Temp Temp src Pulse Resp SpO2   05/04/15 0529 123/64 mmHg 99.1 F (37.3 C) Oral 75 18 99 %  05/03/15 2100 126/68 mmHg 98.3 F (36.8 C) Oral 69 18 97 %  05/03/15 1609 128/84 mmHg 99.1 F (37.3 C) Oral 63 18 96 %     Recent laboratory studies:  Recent Labs  05/03/15 0628  WBC 9.1  HGB 11.3*  HCT 34.9*  PLT 175  NA 138  K 4.1  CL 103  CO2 24  BUN 7  CREATININE 0.79  GLUCOSE 116*  CALCIUM 8.9     Discharge Medications:     Medication List    STOP taking these medications        ibuprofen 200 MG tablet  Commonly known as:  ADVIL,MOTRIN      TAKE these medications        aspirin EC 325 MG tablet  Take 1 tablet (325 mg total) by mouth 2 (two) times daily.     ciprofloxacin 500 MG tablet  Commonly known as:  CIPRO  Take 500 mg by mouth 2 (two) times daily.     indomethacin 75 MG CR capsule  Commonly known as:  INDOCIN SR  Take 1 capsule (75 mg total) by mouth 2 (two) times daily with a meal.     methocarbamol 500 MG tablet  Commonly known as:  ROBAXIN  Take 1 tablet (500 mg total) by mouth 2 (  two) times daily with a meal.     oxycodone 30 MG immediate release tablet  Commonly known as:  ROXICODONE  Take 30 mg by mouth every 4 (four) hours as needed.     oxyCODONE-acetaminophen 5-325 MG tablet  Commonly known as:  ROXICET  Take 1 tablet by mouth every 4 (four) hours as needed.     XELJANZ 5 MG Tabs  Generic drug:  Tofacitinib Citrate  Take 5 mg by mouth 2 (two) times daily.        Diagnostic Studies: Dg Chest 2 View  04/19/2015  CLINICAL DATA:  Knee revision. EXAM: CHEST  2 VIEW COMPARISON:  01/05/2012, 05/28/2009.  CT 05/23/2013, 06/19/2009. FINDINGS: Mediastinum and hilar structures normal. Heart size normal. No focal infiltrate. Mild chronic interstitial prominence. Similar findings noted on prior exams. No pleural effusion or pneumothorax. Stable elevation left hemidiaphragm. Prior cervical spine fusion. Degenerative changes thoracic spine IMPRESSION: No acute cardiopulmonary  disease. Chronic interstitial lung disease. Electronically Signed   By: Maisie Fus  Register   On: 04/19/2015 09:31    Disposition: 01-Home or Self Care      Discharge Instructions    Call MD / Call 911    Complete by:  As directed   If you experience chest pain or shortness of breath, CALL 911 and be transported to the hospital emergency room.  If you develope a fever above 101 F, pus (white drainage) or increased drainage or redness at the wound, or calf pain, call your surgeon's office.     Change dressing    Complete by:  As directed   Change dressing on day 5, then change the dressing daily with sterile 4 x 4 inch gauze dressing and apply TED hose.  You may clean the incision with alcohol prior to redressing.     Constipation Prevention    Complete by:  As directed   Drink plenty of fluids.  Prune juice may be helpful.  You may use a stool softener, such as Colace (over the counter) 100 mg twice a day.  Use MiraLax (over the counter) for constipation as needed.     Diet - low sodium heart healthy    Complete by:  As directed      Driving restrictions    Complete by:  As directed   No driving for 2 weeks     Increase activity slowly as tolerated    Complete by:  As directed      Patient may shower    Complete by:  As directed   You may shower without a dressing once there is no drainage.  Do not wash over the wound.  If drainage remains, cover wound with plastic wrap and then shower.           Follow-up Information    Follow up with Nestor Lewandowsky, MD In 2 weeks.   Specialty:  Orthopedic Surgery   Contact information:   [address] [place] [address] [phone]       Follow up with [facility].   Why:  They will contact you to schedule home therapy visits.   Contact information:   [address] [place] [address] [phone]        Signed: Vear Clock, Naksh Radi R 05/04/2015, 8:09 AM

## 2015-05-04 NOTE — Progress Notes (Addendum)
PATIENT ID: Charles Peck  MRN: [id number]  DOB/AGE:  [birth year] / 45 y.o.  2 Days Post-Op Procedure(s) (LRB): ONE COMPONENT KNEE REVISION (Right)    PROGRESS NOTE Subjective: Patient is alert, oriented, no Nausea, no Vomiting, yes passing gas. Taking PO well. Denies SOB, Chest or Calf Pain. Using Incentive Spirometer, PAS in place. Ambulate WABT with pt walking 55 ft with therapy, Patient reports pain as 2-3/10 .    Objective: Vital signs in last 24 hours: Filed Vitals:   05/03/15 0342 05/03/15 1609 05/03/15 2100 05/04/15 0529  BP: 120/69 128/84 126/68 123/64  Pulse: 72 63 69 75  Temp: 100 F (37.8 C) 99.1 F (37.3 C) 98.3 F (36.8 C) 99.1 F (37.3 C)  TempSrc: Oral Oral Oral Oral  Resp: 16 18 18 18   Height:      Weight:      SpO2: 97% 96% 97% 99%      Intake/Output from previous day: I/O last 3 completed shifts: In: 1000 [P.O.:1000] Out: 2050 [Urine:1900; Stool:150]   Intake/Output this shift:     LABORATORY DATA:  Recent Labs  05/03/15 0628  WBC 9.1  HGB 11.3*  HCT 34.9*  PLT 175  NA 138  K 4.1  CL 103  CO2 24  BUN 7  CREATININE 0.79  GLUCOSE 116*  CALCIUM 8.9    Examination: Neurologically intact Neurovascular intact Sensation intact distally Intact pulses distally Dorsiflexion/Plantar flexion intact Incision: dressing C/D/I No cellulitis present Compartment soft}  Assessment:   2 Days Post-Op Procedure(s) (LRB): ONE COMPONENT KNEE REVISION (Right) ADDITIONAL DIAGNOSIS: Expected Acute Blood Loss Anemia, Chronic LBP and Pain Mgt, morbid obesity  Plan: PT/OT WBAT, Ok for gentle active ROM DVT Prophylaxis:  SCDx72hrs, ASA 325 mg BID x 2 weeks DISCHARGE PLAN: Home, once Pt meets therapy goals.  He has quite a few steps to get into his home. DISCHARGE NEEDS: HHPT, Walker and 3-in-1 comode seat     PHILLIPS, ERIC R 05/04/2015, 8:05 AM

## 2015-05-04 NOTE — Progress Notes (Signed)
Occupational Therapy Treatment Patient Details Name: Charles Peck MRN: [id number] DOB: [birth year] Today's Date: 05/04/2015    History of present illness Pt admitted for redo R TKA with initial one performed 2013. PMHx: bil THA, DDD   OT comments  Pt making steady progress toward ADL goals with use of AE. S with transfer @ RW level. Further OT can be addressed by [place]. Pt to continue to work on stair negotiation with PT prior to D/C.   Follow Up Recommendations  Home health OT;Supervision/Assistance - 24 hour    Equipment Recommendations  None recommended by OT    Recommendations for Other Services      Precautions / Restrictions Precautions Precautions: Knee;Fall Restrictions RLE Weight Bearing: Weight bearing as tolerated       Mobility Bed Mobility Overal bed mobility: Needs Assistance Bed Mobility: Supine to Sit       Sit to supine: Min assist   General bed mobility comments: OOB in chair  Transfers Overall transfer level: Needs assistance Equipment used: Rolling walker (2 wheeled) Transfers: Sit to/from Stand Sit to Stand: Supervision         General transfer comment: able to complete sit - stand from chair with increased time and pt's "own method"    Balance Overall balance assessment: Needs assistance Sitting-balance support: Feet supported;No upper extremity supported Sitting balance-Leahy Scale: Fair     Standing balance support: Bilateral upper extremity supported Standing balance-Leahy Scale: Fair                     ADL                                       Functional mobility during ADLs: Supervision/safety;Rolling walker (with increased time) General ADL Comments: Educated pt on use of AE for LB ADL. Pt able to return demonstrte using sock aid, reacher and long handled sponge. discussed bathroom set up and pt explaining how he transfers into tub. Recommended for pt to sponge bath until he completes tub transfer with  [facility] therapist to reduce risk of falls. Pt verbalized understanding.   Issued long handled shoe horn, large sock aid and long reacher. Pt to trade these out when he purchases kit. Educated pt on importance of terminal knee extension . Pt positioned properly.      Vision                     Perception     Praxis      Cognition   Behavior During Therapy: WFL for tasks assessed/performed Overall Cognitive Status: Within Functional Limits for tasks assessed                       Extremity/Trunk Assessment               Exercises   Shoulder Instructions       General Comments      Pertinent Vitals/ Pain       Pain Assessment: 0-10 Pain Score: 5  Pain Location: R knee Pain Descriptors / Indicators: Sore Pain Intervention(s): Limited activity within patient's tolerance  Home Living                                          Prior  Functioning/Environment              Frequency Min 2X/week     Progress Toward Goals  OT Goals(current goals can now be found in the care plan section)  Progress towards OT goals: Progressing toward goals  Acute Rehab OT Goals Patient Stated Goal: go home OT Goal Formulation: With patient Time For Goal Achievement: 05/17/15 Potential to Achieve Goals: Good ADL Goals Pt Will Perform Lower Body Bathing: with min guard assist;with adaptive equipment;sit to/from stand;with caregiver independent in assisting Pt Will Perform Lower Body Dressing: with min guard assist;with caregiver independent in assisting;with adaptive equipment;sit to/from stand Pt Will Transfer to Toilet: with min guard assist;bedside commode;ambulating Pt Will Perform Toileting - Clothing Manipulation and hygiene: with set-up;with supervision;sitting/lateral leans  Plan Discharge plan remains appropriate    Co-evaluation                 End of Session Equipment Utilized During Treatment: Rolling walker;Gait belt    Activity Tolerance Patient tolerated treatment well   Patient Left in chair;with call bell/phone within reach   Nurse Communication Mobility status        Time: 9532-0233 OT Time Calculation (min): 23 min  Charges: OT General Charges $OT Visit: 1 Procedure OT Treatments $Self Care/Home Management : 23-37 mins  Bethani Brugger,HILLARY 05/04/2015, 3:13 PM   [facility], OTR/L  [phone] 05/04/2015

## 2015-05-04 NOTE — Progress Notes (Signed)
Physical Therapy Treatment Patient Details Name: Charles Peck MRN: [id number] DOB: 14-Apr-1970 Today's Date: 05/04/2015    History of Present Illness Pt admitted for redo R TKA with initial one performed 2013. PMHx: bil THA, DDD    PT Comments    Patient continues to demonstrate limited knee flexion and has difficulty ascending stairs. Pt is progressing slowly toward goals with improved mobility but continues to need assistance for most mobility. Pt would benefit from another night stay and PT session in the am before returning home. Wife present for session and expressed concerns about how much assistance she will be able to provide. Continue to progress as tolerated.   Follow Up Recommendations  Home health PT;Supervision for mobility/OOB     Equipment Recommendations  None recommended by PT    Recommendations for Other Services OT consult     Precautions / Restrictions Precautions Precautions: Knee;Fall Restrictions RLE Weight Bearing: Weight bearing as tolerated    Mobility  Bed Mobility Overal bed mobility: Needs Assistance Bed Mobility: Supine to Sit       Sit to supine: Min assist   General bed mobility comments: assist to elveate trunk into sitting with HOB flat and min use of bedrails; increased time needed to bring bilat LE to EOB with vc for technique  Transfers Overall transfer level: Needs assistance Equipment used: Rolling walker (2 wheeled) Transfers: Sit to/from Stand Sit to Stand: Min assist;Mod assist;+2 physical assistance;From elevated surface         General transfer comment: vc for hand placement and technique; assist to power up into standing and maintain balance for WS to R LE; vc for foot placement   Ambulation/Gait Ambulation/Gait assistance: Min guard Ambulation Distance (Feet): 150 Feet Assistive device: Rolling walker (2 wheeled) Gait Pattern/deviations: Step-to pattern;Decreased stance time - right;Decreased step length - left;Trunk  flexed;Wide base of support   Gait velocity interpretation: Below normal speed for age/gender General Gait Details: vc for sequencing, position of RW, posture; pt with wide BOS and able to correct for a few steps after cues; tendency to keep R knee flexed slightly    Stairs Stairs: Yes Stairs assistance: Mod assist Stair Management: Two rails;Backwards;Forwards Number of Stairs: 4 (2X2) General stair comments: educated pt on sequencing and technique; pt required max encouragement to attempt ascend and remained anxious about WB on R LE throughout session; pt with difficulty flexing R knee to clear edge of step  Wheelchair Mobility    Modified Rankin (Stroke Patients Only)       Balance Overall balance assessment: Needs assistance Sitting-balance support: Feet supported;No upper extremity supported Sitting balance-Leahy Scale: Fair     Standing balance support: Bilateral upper extremity supported Standing balance-Leahy Scale: Poor                      Cognition Arousal/Alertness: Awake/alert Behavior During Therapy: WFL for tasks assessed/performed Overall Cognitive Status: Within Functional Limits for tasks assessed                      Exercises Total Joint Exercises Quad Sets: AROM;Right;10 reps;Supine Heel Slides: AAROM;Right;10 reps;Supine Goniometric ROM: 10-40    General Comments        Pertinent Vitals/Pain Pain Assessment: 0-10 Pain Score: 5  Pain Location: R knee Pain Descriptors / Indicators: Sore Pain Intervention(s): Limited activity within patient's tolerance;Monitored during session;Premedicated before session;Repositioned    Home Living  Prior Function            PT Goals (current goals can now be found in the care plan section) Acute Rehab PT Goals Patient Stated Goal: go home Progress towards PT goals: Progressing toward goals    Frequency  7X/week    PT Plan Current plan remains  appropriate    Co-evaluation PT/OT/SLP Co-Evaluation/Treatment: Yes           End of Session Equipment Utilized During Treatment: Gait belt Activity Tolerance: Patient tolerated treatment well Patient left: in chair;with call bell/phone within reach;with family/visitor present     Time: 8341-9622 PT Time Calculation (min) (ACUTE ONLY): 46 min  Charges:  $Gait Training: 23-37 mins $Therapeutic Exercise: 8-22 mins                    G Codes:      Derek Mound, PTA Pager: [phone]   05/04/2015, 1:21 PM

## 2015-05-04 NOTE — Progress Notes (Signed)
Physical Therapy Treatment Patient Details Name: Charles Peck MRN: [id number] DOB: 20-Apr-1970 Today's Date: 05/04/2015    History of Present Illness Pt admitted for redo R TKA with initial one performed 2013. PMHx: bil THA, DDD    PT Comments    Patient continues to make improvements with mobility but with limited ROM. This session focused on gait, HEP, and sit to stand transfers. Pt needs stair training again next session -- has 12 steps to enter home.   Follow Up Recommendations  Home health PT;Supervision for mobility/OOB     Equipment Recommendations  None recommended by PT    Recommendations for Other Services OT consult     Precautions / Restrictions Precautions Precautions: Knee;Fall Restrictions RLE Weight Bearing: Weight bearing as tolerated    Mobility  Bed Mobility Overal bed mobility: Needs Assistance Bed Mobility: Supine to Sit       Sit to supine: Min assist   General bed mobility comments: OOB in chair upon arrival  Transfers Overall transfer level: Needs assistance Equipment used: Rolling walker (2 wheeled) Transfers: Sit to/from Stand Sit to Stand: Min assist;From elevated surface;Mod assist         General transfer comment: vc for hand/foot placement, sequencing, and technique; pt continues to be very guarded and anxious about WB on R LE; sit to stand X3 with improvement each trial and pt becoming less hesitant to transition hands from chair to RW  Ambulation/Gait Ambulation/Gait assistance: Min guard Ambulation Distance (Feet): 50 Feet Assistive device: Rolling walker (2 wheeled) Gait Pattern/deviations: Step-to pattern;Antalgic;Trunk flexed;Wide base of support   Gait velocity interpretation: Below normal speed for age/gender General Gait Details: pt with improved abiltyt to WB on R LE but still heavy reliance on RW; vc for upright posture, position of RW, and engaging R quad before WS   Stairs Stairs: Yes Stairs assistance: Mod  assist Stair Management: Two rails;Backwards;Forwards Number of Stairs: 4 (2X2) General stair comments: educated pt on sequencing and technique; pt required max encouragement to attempt ascend and remained anxious about WB on R LE throughout session; pt with difficulty flexing R knee to clear edge of step  Wheelchair Mobility    Modified Rankin (Stroke Patients Only)       Balance Overall balance assessment: Needs assistance Sitting-balance support: Feet supported;No upper extremity supported Sitting balance-Leahy Scale: Fair     Standing balance support: Bilateral upper extremity supported Standing balance-Leahy Scale: Poor                      Cognition Arousal/Alertness: Awake/alert Behavior During Therapy: WFL for tasks assessed/performed Overall Cognitive Status: Within Functional Limits for tasks assessed                      Exercises Total Joint Exercises Quad Sets: AROM;Right;10 reps;Supine Short Arc Quad: AROM;Right;10 reps;Seated Heel Slides: AAROM;Right;10 reps;Supine Hip ABduction/ADduction: AROM;Right;10 reps;Seated Straight Leg Raises: AAROM;Right;10 reps;Seated Knee Flexion: AROM;Right;10 reps;Seated;AAROM Goniometric ROM: 10-40    General Comments        Pertinent Vitals/Pain Pain Assessment: Faces Pain Score: 5  Faces Pain Scale: Hurts even more Pain Location: R knee with flexion Pain Descriptors / Indicators: Grimacing Pain Intervention(s): Limited activity within patient's tolerance;Monitored during session;Premedicated before session;Repositioned    Home Living                      Prior Function            PT  Goals (current goals can now be found in the care plan section) Acute Rehab PT Goals Patient Stated Goal: go home Progress towards PT goals: Progressing toward goals    Frequency  7X/week    PT Plan Current plan remains appropriate    Co-evaluation PT/OT/SLP Co-Evaluation/Treatment: Yes            End of Session Equipment Utilized During Treatment: Gait belt Activity Tolerance: Patient tolerated treatment well Patient left: in chair;with call bell/phone within reach;with family/visitor present     Time: 8270-7867 PT Time Calculation (min) (ACUTE ONLY): 31 min  Charges:  $Gait Training: 8-22 mins $Therapeutic Exercise: 8-22 mins                    G Codes:      Derek Mound, PTA Pager: [phone]   05/04/2015, 4:01 PM

## 2015-05-05 LAB — CBC
HEMATOCRIT: 32.4 % — AB (ref 39.0–52.0)
HEMOGLOBIN: 10.3 g/dL — AB (ref 13.0–17.0)
MCH: 26.5 pg (ref 26.0–34.0)
MCHC: 31.8 g/dL (ref 30.0–36.0)
MCV: 83.5 fL (ref 78.0–100.0)
PLATELETS: 149 10*3/uL — AB (ref 150–400)
RBC: 3.88 MIL/uL — ABNORMAL LOW (ref 4.22–5.81)
RDW: 14.5 % (ref 11.5–15.5)
WBC: 6.9 10*3/uL (ref 4.0–10.5)

## 2015-05-05 NOTE — Progress Notes (Addendum)
    Patient doing well 3 days PO 1 component knee revision, has made good progress in PT/OT due to have stair training today prior to D/C reports mild well controlled pain 2-5/10. Has been eating well NL B/B function, passing gas   Physical Exam: BP 90/56 mmHg  Pulse 70  Temp(Src) 98 F (36.7 C) (Oral)  Resp 18  Ht 6\' 4"  (1.93 m)  Wt 152.409 kg (336 lb)  BMI 40.92 kg/m2  SpO2 98%  Dressing in place, clean and dry, Neurologically intact, Neurovascular intact, Sensation intact distally, Intact pulses distally Dorsiflexion/Plantar flexion intact No cellulitis present Compartment soft NVI  CBC Latest Ref Rng 05/05/2015 05/04/2015 05/03/2015  WBC 4.0 - 10.5 K/uL 6.9 9.8 9.1  Hemoglobin 13.0 - 17.0 g/dL 10.3(L) 11.0(L) 11.3(L)  Hematocrit 39.0 - 52.0 % 32.4(L) 35.3(L) 34.9(L)  Platelets 150 - 400 K/uL 149(L) 168 175   CMP Latest Ref Rng 05/03/2015 04/19/2015 03/29/2013  Glucose 65 - 99 mg/dL 03/31/2013) 94 70  BUN 6 - 20 mg/dL 7 7 12   Creatinine 0.61 - 1.24 mg/dL 656(C 1.27  Sodium 135 - 145 mmol/L 138 142 143  Potassium 3.5 - 5.1 mmol/L 4.1 3.6 3.9  Chloride 101 - 111 mmol/L 103 105 102  CO2 22 - 32 mmol/L 24 27 28   Calcium 8.9 - 10.3 mg/dL 8.9 9.2 9.4    POD #3 s/p 1 compartment R knee revision per Dr 5.17 team   -  - up with PT/OT, encourage ambulation WBAT stair training today - Cont current pain meds, scripts in chart  - likely d/c home today after PT  - HHPT, Walker and 3-in-1 comode seat - DVT Prophylaxis: SCDx72hrs, ASA 325 mg BID x 2 weeks

## 2015-05-05 NOTE — Progress Notes (Signed)
Physical Therapy Treatment Patient Details Name: Charles Peck MRN: [id number] DOB: 07/18/1970 Today's Date: 05/05/2015    History of Present Illness Pt admitted for redo R TKA with initial one performed 2013. PMHx: bil THA, DDD    PT Comments    Making excellent progress today. I have answered all patient's question regarding PT and mobility.   Pt feels ready for DC home today.    Follow Up Recommendations  Home health PT;Supervision for mobility/OOB     Equipment Recommendations  None recommended by PT    Recommendations for Other Services       Precautions / Restrictions Precautions Precautions: Knee;Fall Restrictions Weight Bearing Restrictions: Yes RLE Weight Bearing: Weight bearing as tolerated    Mobility  Bed Mobility Overal bed mobility: Modified Independent Bed Mobility: Supine to Sit     Supine to sit: Modified independent (Device/Increase time)     General bed mobility comments: pt used rails to assist getting to sitting  Transfers Overall transfer level: Needs assistance Equipment used: Rolling walker (2 wheeled) Transfers: Sit to/from Stand Sit to Stand: Min guard         General transfer comment: Performed transfers much better today  Ambulation/Gait Ambulation/Gait assistance: Min guard Ambulation Distance (Feet): 60 Feet Assistive device: Rolling walker (2 wheeled) Gait Pattern/deviations: Step-to pattern;Decreased stride length     General Gait Details: Continues to rely on UE's with gait and is accepting weight through RLE    Stairs Stairs: Yes Stairs assistance: Min guard Stair Management: Two rails;Step to pattern;Backwards Number of Stairs: 2 General stair comments: Performed twice. Pt prefers to go up backwards and down forwards  Wheelchair Mobility    Modified Rankin (Stroke Patients Only)       Balance                                    Cognition Arousal/Alertness: Awake/alert Behavior During  Therapy: WFL for tasks assessed/performed Overall Cognitive Status: Within Functional Limits for tasks assessed                      Exercises Total Joint Exercises Ankle Circles/Pumps: AROM;Both;5 reps;Seated Quad Sets: AROM;Right;10 reps;Supine Hip ABduction/ADduction: AROM;Right;10 reps;Supine Straight Leg Raises: AAROM;Right;10 reps;Supine Long Arc Quad: AROM;Right;10 reps;Seated Knee Flexion: AROM;Right;10 reps;Seated;AAROM Goniometric ROM: 0-70    General Comments        Pertinent Vitals/Pain Pain Assessment: 0-10 Pain Score: 4  Faces Pain Scale: Hurts even more Pain Location: right knee Pain Intervention(s): Limited activity within patient's tolerance;Premedicated before session    Home Living                      Prior Function            PT Goals (current goals can now be found in the care plan section) Acute Rehab PT Goals Patient Stated Goal: go home Progress towards PT goals: Progressing toward goals    Frequency  7X/week    PT Plan Current plan remains appropriate    Co-evaluation PT/OT/SLP Co-Evaluation/Treatment: Yes           End of Session Equipment Utilized During Treatment: Gait belt Activity Tolerance: Patient tolerated treatment well Patient left: in chair;with call bell/phone within reach;with family/visitor present     Time: 8527-7824 PT Time Calculation (min) (ACUTE ONLY): 30 min  Charges:  $Gait Training: 8-22 mins $Therapeutic Exercise: 8-22 mins  G Codes:      Donnella Sham 2015/05/09, 10:25 AM  Lavona Mound, PT  [phone] 05-09-15

## 2015-05-06 LAB — BODY FLUID CULTURE: Culture: NO GROWTH

## 2015-05-08 LAB — ANAEROBIC CULTURE

## 2016-02-15 ENCOUNTER — Other Ambulatory Visit: Payer: Self-pay | Admitting: Family Medicine

## 2016-02-15 DIAGNOSIS — R609 Edema, unspecified: Secondary | ICD-10-CM

## 2016-03-11 ENCOUNTER — Other Ambulatory Visit (HOSPITAL_COMMUNITY): Payer: 59

## 2016-03-17 DIAGNOSIS — G4730 Sleep apnea, unspecified: Secondary | ICD-10-CM

## 2016-03-17 HISTORY — DX: Sleep apnea, unspecified: G47.30

## 2016-07-08 ENCOUNTER — Telehealth (HOSPITAL_COMMUNITY): Payer: Self-pay | Admitting: Family Medicine

## 2016-07-09 ENCOUNTER — Other Ambulatory Visit (HOSPITAL_COMMUNITY): Payer: 59

## 2016-07-17 NOTE — Telephone Encounter (Signed)
  07/08/2016 10:03 AM Phone (Outgoing) Charles Peck, Charles Peck (Self) [phone] (M)   Left Message - Called pt and lmsg for him to CB to r/s his echo for tomorrow at 4:00 due to being improperly scheduled.     By Elita Boone           Close PreviousNext

## 2016-07-22 ENCOUNTER — Other Ambulatory Visit: Payer: Self-pay

## 2016-07-22 ENCOUNTER — Other Ambulatory Visit: Payer: Self-pay | Admitting: Family Medicine

## 2016-07-22 ENCOUNTER — Ambulatory Visit (HOSPITAL_COMMUNITY): Payer: PRIVATE HEALTH INSURANCE | Attending: Cardiovascular Disease

## 2016-07-22 DIAGNOSIS — I517 Cardiomegaly: Secondary | ICD-10-CM | POA: Diagnosis not present

## 2016-07-22 DIAGNOSIS — R609 Edema, unspecified: Secondary | ICD-10-CM | POA: Diagnosis present

## 2016-07-22 MED ORDER — PERFLUTREN LIPID MICROSPHERE
1.0000 mL | INTRAVENOUS | Status: AC | PRN
  Administered 2016-07-22: 3 mL via INTRAVENOUS

## 2017-02-25 ENCOUNTER — Ambulatory Visit: Payer: PRIVATE HEALTH INSURANCE | Admitting: Podiatry

## 2017-02-25 ENCOUNTER — Encounter: Payer: Self-pay | Admitting: Podiatry

## 2017-02-25 VITALS — BP 190/93 | HR 73

## 2017-02-25 DIAGNOSIS — M19079 Primary osteoarthritis, unspecified ankle and foot: Secondary | ICD-10-CM | POA: Diagnosis not present

## 2017-02-25 DIAGNOSIS — M79671 Pain in right foot: Secondary | ICD-10-CM

## 2017-02-25 DIAGNOSIS — L851 Acquired keratosis [keratoderma] palmaris et plantaris: Secondary | ICD-10-CM | POA: Diagnosis not present

## 2017-02-25 DIAGNOSIS — M79672 Pain in left foot: Secondary | ICD-10-CM | POA: Diagnosis not present

## 2017-02-25 DIAGNOSIS — M2040 Other hammer toe(s) (acquired), unspecified foot: Secondary | ICD-10-CM | POA: Diagnosis not present

## 2017-02-25 NOTE — Patient Instructions (Signed)
Seen for painful lesions on both feet. Painful pre ulcerative callused lesions debrided. Return as the area becomes painful or when it gets thick build up.  Patient will return as needed.

## 2017-02-25 NOTE — Progress Notes (Signed)
SUBJECTIVE: 46 y.o. year old male presents complaining of painful lesion under the ball of both feet duration of several years, 3-4. He was tended by his wife who trims the lesion. Antalgic gait and stiffness in both lower limbs. Walks with cane.   History of foot surgery by Dr. Gae Bon, possible bilateral bunion surgery and Gastrocnemius release, 5-6 years ago.  Review of Systems  Constitutional: Negative.   HENT: Negative.   Respiratory: Negative.   Cardiovascular: Negative.   Gastrointestinal: Negative.   Genitourinary: Negative.   Musculoskeletal: Positive for back pain and joint pain.       Severe arthritic joint pain difficulty walking. Diagnosed with Rheumatoid arthritis in 2015.  Skin: Negative.   Neurological: Negative.    OBJECTIVE: DERMATOLOGIC EXAMINATION: Pre ulcerative thick keratotic tissue first MPJ bilateral.  VASCULAR EXAMINATION OF LOWER LIMBS: Pedal pulses are palpable on DP and not palpable on PT bilateral.  Capillary Filling times within 3 seconds in all digits.  Positive for bilateral ankle edema. Temperature gradient from tibial crest to dorsum of foot is within normal bilateral.  NEUROLOGIC EXAMINATION OF THE LOWER LIMBS: Epicritic and tactile sensations grossly intact.  MUSCULOSKELETAL EXAMINATION: Stiffness ankle joint bilateral. Varus rotated forefoot. IPJ flexion contracture both great toes. Plantar flexed first metatarsal bilateral.  ASSESSMENT: Arthritic joint pain foot and ankle. Porokeratosis sub first MPJ bilateral, pre ulcerative and symptomatic. Hammer toe deformity both great toes. Plantar flexed first MPJ bilateral. Pain in both feet.  PLAN: Reviewed findings and available treatment options. Painful lesions debrided. Return as the area becomes painful or when it gets thick build up.  Patient will return as needed.

## 2017-04-23 ENCOUNTER — Ambulatory Visit: Payer: PRIVATE HEALTH INSURANCE | Admitting: Podiatry

## 2017-04-23 DIAGNOSIS — M2040 Other hammer toe(s) (acquired), unspecified foot: Secondary | ICD-10-CM

## 2017-04-23 DIAGNOSIS — M19079 Primary osteoarthritis, unspecified ankle and foot: Secondary | ICD-10-CM | POA: Diagnosis not present

## 2017-04-23 DIAGNOSIS — M79671 Pain in right foot: Secondary | ICD-10-CM

## 2017-04-23 DIAGNOSIS — M79672 Pain in left foot: Secondary | ICD-10-CM

## 2017-04-23 DIAGNOSIS — L851 Acquired keratosis [keratoderma] palmaris et plantaris: Secondary | ICD-10-CM | POA: Diagnosis not present

## 2017-04-23 NOTE — Patient Instructions (Signed)
Seen for pre ulcerative plantar lesion on both feet. Lesions debrided. Treatment options discussed. Return as needed.

## 2017-04-23 NOTE — Progress Notes (Signed)
Subjective: 47 y.o. year old male patient presents complaining returning lesion on ball of both feet. He was seen 8 weeks ago, 02/25/17 for the same condition.   History of foot surgery by Dr. Luanna Salk, possible bilateral bunion surgery and Gastrocnemius release, 5-6 years ago.  Objective: Dermatologic: Thick yellow deformed nails x 10. Thick plantar calluses plantar first MPJ bilateral. Vascular: Pedal pulses are palpable on DP and not palpable on PT bilateral. Positive of bilateral ankle edema. Orthopedic: Ankle joint stiffness bilateral. Cavovarus foot bilateral. IPJ flexion deformity both great toes. Plantar flexed first met bilateral. Neurologic: All epicritic and tactile sensations grossly intact.  Assessment: Dystrophic mycotic nails x 10. Painful plantar callus bilateral.  Treatment: All mycotic nails, corns, calluses debrided.  Return in 3 months or as needed.

## 2017-04-27 ENCOUNTER — Encounter: Payer: Self-pay | Admitting: Podiatry

## 2017-06-17 ENCOUNTER — Encounter: Payer: Self-pay | Admitting: Podiatry

## 2017-06-17 ENCOUNTER — Ambulatory Visit: Payer: PRIVATE HEALTH INSURANCE | Admitting: Podiatry

## 2017-06-17 DIAGNOSIS — M79672 Pain in left foot: Secondary | ICD-10-CM

## 2017-06-17 DIAGNOSIS — M79671 Pain in right foot: Secondary | ICD-10-CM

## 2017-06-17 DIAGNOSIS — L851 Acquired keratosis [keratoderma] palmaris et plantaris: Secondary | ICD-10-CM

## 2017-06-17 DIAGNOSIS — M2040 Other hammer toe(s) (acquired), unspecified foot: Secondary | ICD-10-CM | POA: Diagnosis not present

## 2017-06-17 NOTE — Progress Notes (Signed)
Subjective: 47 y.o. year old male patient presents complaining of painful feet on bottom of both feet from callus. Patient request to be trimmed. Ambulates with aid of a cane.  HPI:  Bad knee on right, bilateral hip replacement, and back pain with Rheumatoid cause him difficulty walking. History of foot surgery by Dr. Gae Bon, possible bilateral bunion surgery and Gastrocnemius release, 5-6 years ago.  Objective: Dermatologic: Broad plantar callus under the first MPJ area bilateral, symptomatic. No open skin lesions noted. Vascular: Pedal pulses are all palpable on DP and not on PT bilateral. Orthopedic: Severe cavovarus foot bilateral, IPJ flexion deformity both great toes, Plantar flexed first metatarsal bilateral, stiffness of ankle joint bilateral. Contracted 5th toe with digital corn bilateral. Neurologic: All epicritic and tactile sensations grossly intact.  Assessment: Painful plantar keratosis bilateral. Cavovarus foot bilateral. Plantar flexed first metatarsal bilateral. 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 Difficulty walking.  Treatment: All painful lesions debrided.  Return as needed.

## 2017-06-17 NOTE — Patient Instructions (Signed)
Seen for hypertrophic calluses. All calluses debrided. Return as needed.  

## 2017-08-06 ENCOUNTER — Ambulatory Visit: Payer: PRIVATE HEALTH INSURANCE | Admitting: Podiatry

## 2017-08-06 ENCOUNTER — Encounter: Payer: Self-pay | Admitting: Podiatry

## 2017-08-06 DIAGNOSIS — M79672 Pain in left foot: Secondary | ICD-10-CM | POA: Diagnosis not present

## 2017-08-06 DIAGNOSIS — B351 Tinea unguium: Secondary | ICD-10-CM

## 2017-08-06 DIAGNOSIS — M2040 Other hammer toe(s) (acquired), unspecified foot: Secondary | ICD-10-CM | POA: Diagnosis not present

## 2017-08-06 DIAGNOSIS — M79671 Pain in right foot: Secondary | ICD-10-CM

## 2017-08-06 DIAGNOSIS — L97511 Non-pressure chronic ulcer of other part of right foot limited to breakdown of skin: Secondary | ICD-10-CM | POA: Diagnosis not present

## 2017-08-06 DIAGNOSIS — M21961 Unspecified acquired deformity of right lower leg: Secondary | ICD-10-CM | POA: Diagnosis not present

## 2017-08-06 NOTE — Progress Notes (Signed)
Subjective: 47 y.o. year old male patient presents complaining of pain in both feet for over 2 weeks. He called and changed his appointment to earliest available time. He was seen 7 weeks ago 06/17/17 for the same problem. Patient ambulates with aid of a cane.  HPI:  Bad knee on right, bilateral hip replacement, and back pain with Rheumatoid cause him difficulty walking. History of foot surgery by Dr. Gae Bon, possible bilateral bunion surgery and Gastrocnemius release, 5-6 years ago.  Objective: Dermatologic: Pre ulcerative plantar callus under the first MPJ area bilateral, symptomatic R>L. Thick dystrophic nails x 10 with fungal debris. Both great toe nails are dystrophic and ingrown. Vascular: Pedal pulses are all palpable on DP and not on PT bilateral. Orthopedic: Severe cavovarus foot bilateral, IPJ flexion deformity both great toes,  Plantar flexed first metatarsal bilateral, stiffness of ankle joint bilateral.  Hyperextended 2nd toe left at PIPJ, post surgical. Contracted 5th toe with digital corn bilateral. Neurologic: All epicritic and tactile sensations grossly intact.  Assessment: Pre ulcerative plantar keratosis bilateral R>L painful. Cavovarus foot bilateral. Plantar flexed first metatarsal bilateral. 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 Difficulty walking. Onychomycosis x 10. Ingrown hallucal nails bilateral.  Treatment: All lesions debrided.  All nails debrided. Bleeding right great toe nail cleansed with Iodine and dressing applied. Return in 6 weeks or sooner if needed.

## 2017-08-06 NOTE — Patient Instructions (Signed)
Seen for ulcerating callus and nails. All lesions debrided. Return in 6 weeks.

## 2017-12-28 DIAGNOSIS — G894 Chronic pain syndrome: Secondary | ICD-10-CM | POA: Diagnosis not present

## 2017-12-28 DIAGNOSIS — M545 Low back pain: Secondary | ICD-10-CM | POA: Diagnosis not present

## 2017-12-28 DIAGNOSIS — M25561 Pain in right knee: Secondary | ICD-10-CM | POA: Diagnosis not present

## 2017-12-28 DIAGNOSIS — Z79891 Long term (current) use of opiate analgesic: Secondary | ICD-10-CM | POA: Diagnosis not present

## 2018-01-25 DIAGNOSIS — G894 Chronic pain syndrome: Secondary | ICD-10-CM | POA: Diagnosis not present

## 2018-01-25 DIAGNOSIS — M545 Low back pain: Secondary | ICD-10-CM | POA: Diagnosis not present

## 2018-01-25 DIAGNOSIS — M25561 Pain in right knee: Secondary | ICD-10-CM | POA: Diagnosis not present

## 2018-01-25 DIAGNOSIS — Z79891 Long term (current) use of opiate analgesic: Secondary | ICD-10-CM | POA: Diagnosis not present

## 2018-02-22 DIAGNOSIS — M25561 Pain in right knee: Secondary | ICD-10-CM | POA: Diagnosis not present

## 2018-02-22 DIAGNOSIS — M545 Low back pain: Secondary | ICD-10-CM | POA: Diagnosis not present

## 2018-02-22 DIAGNOSIS — Z79891 Long term (current) use of opiate analgesic: Secondary | ICD-10-CM | POA: Diagnosis not present

## 2018-02-22 DIAGNOSIS — G894 Chronic pain syndrome: Secondary | ICD-10-CM | POA: Diagnosis not present

## 2018-03-29 DIAGNOSIS — M545 Low back pain: Secondary | ICD-10-CM | POA: Diagnosis not present

## 2018-03-29 DIAGNOSIS — G894 Chronic pain syndrome: Secondary | ICD-10-CM | POA: Diagnosis not present

## 2018-03-29 DIAGNOSIS — Z79891 Long term (current) use of opiate analgesic: Secondary | ICD-10-CM | POA: Diagnosis not present

## 2018-03-29 DIAGNOSIS — M25561 Pain in right knee: Secondary | ICD-10-CM | POA: Diagnosis not present

## 2018-04-02 ENCOUNTER — Other Ambulatory Visit: Payer: Self-pay

## 2018-04-02 ENCOUNTER — Emergency Department (HOSPITAL_COMMUNITY)
Admission: EM | Admit: 2018-04-02 | Discharge: 2018-04-03 | Disposition: A | Discharge status: Home / Self Care | Payer: Medicare HMO | Attending: Emergency Medicine | Admitting: Emergency Medicine

## 2018-04-02 ENCOUNTER — Encounter (HOSPITAL_COMMUNITY): Payer: Self-pay | Admitting: *Deleted

## 2018-04-02 DIAGNOSIS — R600 Localized edema: Secondary | ICD-10-CM

## 2018-04-02 DIAGNOSIS — I10 Essential (primary) hypertension: Secondary | ICD-10-CM | POA: Diagnosis not present

## 2018-04-02 DIAGNOSIS — R2243 Localized swelling, mass and lump, lower limb, bilateral: Secondary | ICD-10-CM | POA: Diagnosis present

## 2018-04-02 DIAGNOSIS — Z96651 Presence of right artificial knee joint: Secondary | ICD-10-CM | POA: Insufficient documentation

## 2018-04-02 DIAGNOSIS — Z87891 Personal history of nicotine dependence: Secondary | ICD-10-CM | POA: Diagnosis not present

## 2018-04-02 DIAGNOSIS — Z96643 Presence of artificial hip joint, bilateral: Secondary | ICD-10-CM | POA: Insufficient documentation

## 2018-04-02 DIAGNOSIS — E876 Hypokalemia: Secondary | ICD-10-CM | POA: Diagnosis not present

## 2018-04-02 NOTE — ED Triage Notes (Signed)
Pt presents with bilat LE edema.  RLE ? Cellulitis.  Pt stated "they have been like this about 3 weeks.  I drink about a 12 pack of Mtn Dew every day."

## 2018-04-03 ENCOUNTER — Emergency Department (HOSPITAL_COMMUNITY): Payer: Medicare HMO

## 2018-04-03 ENCOUNTER — Ambulatory Visit (HOSPITAL_COMMUNITY): Admission: RE | Admit: 2018-04-03 | Payer: Medicare HMO | Source: Ambulatory Visit

## 2018-04-03 DIAGNOSIS — R600 Localized edema: Secondary | ICD-10-CM | POA: Diagnosis not present

## 2018-04-03 LAB — BASIC METABOLIC PANEL
ANION GAP: 11 (ref 5–15)
BUN: 9 mg/dL (ref 6–20)
CO2: 30 mmol/L (ref 22–32)
Calcium: 8.8 mg/dL — ABNORMAL LOW (ref 8.9–10.3)
Chloride: 96 mmol/L — ABNORMAL LOW (ref 98–111)
Creatinine, Ser: 0.8 mg/dL (ref 0.61–1.24)
GFR calc Af Amer: >60 mL/min (ref >60)
GFR calc non Af Amer: >60 mL/min (ref >60)
Glucose, Bld: 127 mg/dL — ABNORMAL HIGH (ref 70–99)
Potassium: 3 mmol/L — ABNORMAL LOW (ref 3.5–5.1)
Sodium: 137 mmol/L (ref 135–145)

## 2018-04-03 LAB — CBC WITH DIFFERENTIAL/PLATELET
Abs Immature Granulocytes: 0.03 10*3/uL (ref 0.00–0.07)
Basophils Absolute: 0 10*3/uL (ref 0.0–0.1)
Basophils Relative: 0 %
Eosinophils Absolute: 0 10*3/uL (ref 0.0–0.5)
Eosinophils Relative: 0 %
HCT: 39 % (ref 39.0–52.0)
Hemoglobin: 11.9 g/dL — ABNORMAL LOW (ref 13.0–17.0)
IMMATURE GRANULOCYTES: 0 %
Lymphocytes Relative: 18 %
Lymphs Abs: 1.3 10*3/uL (ref 0.7–4.0)
MCH: 26.6 pg (ref 26.0–34.0)
MCHC: 30.5 g/dL (ref 30.0–36.0)
MCV: 87.1 fL (ref 80.0–100.0)
Monocytes Absolute: 1 10*3/uL (ref 0.1–1.0)
Monocytes Relative: 14 %
NEUTROS PCT: 68 %
Neutro Abs: 5 10*3/uL (ref 1.7–7.7)
Platelets: 132 10*3/uL — ABNORMAL LOW (ref 150–400)
RBC: 4.48 MIL/uL (ref 4.22–5.81)
RDW: 14.5 % (ref 11.5–15.5)
WBC: 7.4 10*3/uL (ref 4.0–10.5)
nRBC: 0 % (ref 0.0–0.2)

## 2018-04-03 LAB — TROPONIN I: Troponin I: <0.03 ng/mL (ref <0.03)

## 2018-04-03 LAB — BRAIN NATRIURETIC PEPTIDE: B Natriuretic Peptide: 20.4 pg/mL (ref 0.0–100.0)

## 2018-04-03 MED ORDER — RIVAROXABAN (XARELTO) VTE STARTER PACK (15 & 20 MG): ORAL_TABLET | ORAL | 0 refills | Status: DC

## 2018-04-03 MED ORDER — ENOXAPARIN SODIUM 80 MG/0.8ML ~~LOC~~ SOLN
80.0000 mg | SUBCUTANEOUS | Status: AC
  Administered 2018-04-03: 80 mg via SUBCUTANEOUS
  Filled 2018-04-03: qty 0.8

## 2018-04-03 MED ORDER — POTASSIUM CHLORIDE CRYS ER 20 MEQ PO TBCR: 40.0000 meq | EXTENDED_RELEASE_TABLET | Freq: Every day | ORAL | 0 refills | Status: DC

## 2018-04-03 MED ORDER — ENOXAPARIN SODIUM 300 MG/3ML IJ SOLN: 1.0000 mg/kg | Freq: Once | INTRAMUSCULAR | Status: DC

## 2018-04-03 MED ORDER — CLINDAMYCIN HCL 300 MG PO CAPS: 300.0000 mg | ORAL_CAPSULE | Freq: Four times a day (QID) | ORAL | 0 refills | Status: DC

## 2018-04-03 MED ORDER — CLINDAMYCIN HCL 300 MG PO CAPS
300.0000 mg | ORAL_CAPSULE | Freq: Once | ORAL | Status: AC
  Administered 2018-04-03: 300 mg via ORAL
  Filled 2018-04-03: qty 1

## 2018-04-03 NOTE — Discharge Instructions (Signed)
I am not sure if the swelling and redness in your leg is from a DVT or skin infection.  I have given you antibiotics (clindamycin) and blood thinners (Xarelto) here.  Please get your ultrasound done as per instructions.  If the ultrasound is positive for a blood clot do not take the antibiotics and start the blood thinner.  If the ultrasound is negative for blood clot do not take the blood thinners and start antibiotics. Either way follow-up with your doctor in the next week or so to make sure things are improving and to recheck your potassium and further management of your symptoms.

## 2018-04-03 NOTE — ED Provider Notes (Signed)
Emergency Department Provider Note   I have reviewed the triage vital signs and the nursing notes.   HISTORY  Chief Complaint Leg Swelling (bilat)   HPI Charles Peck is a 48 y.o. male with multiple past medical problems as documented below the presents to the emergency department today secondary to bilateral lower extremity edema right greater than left.  Patient states is been progressively worsening for the last few weeks but the right one has gotten much worse over the last few days its associate with redness and worsening pain as well.  States he is on hydrochlorothiazide for blood pressure control was not on any fluid pills.  He is never been worked up for CHF.  Does have any chest pain or shortness of breath at this time.  He states that recently has been sleeping in the recliner more than in bed and has been raising his legs like he supposed to.  Urinate normally.  No new pains elsewhere. No other associated or modifying symptoms.    Past Medical History:  Diagnosis Date  . Degenerative joint disease   . Headache    not in a while    . RA (rheumatoid arthritis) (HCC)   . Testicle swelling 03-29-13   exploratory surgery planned of right testicle  . UTI (urinary tract infection)     Patient Active Problem List   Diagnosis Date Noted  . Arthrofibrosis of knee joint 05/02/2015  . Loosening of knee joint prosthesis (HCC), Right 04/20/2015  . Osteoarthritis of right knee 01/15/2012  . Nonspecific (abnormal) findings on radiological and other examination of body structure 06/28/2009  . CT, CHEST, ABNORMAL 06/28/2009  . DISC DISEASE, LUMBAR 06/27/2009    Past Surgical History:  Procedure Laterality Date  . BUNIONECTOMY  2012   also foot surgery to both feet  . CERVICAL FUSION  2005  . CYSTOSCOPY N/A 04/01/2013   Procedure: CYSTOSCOPY FLEXIBLE EXCISIONAL BIOPSY ;  Surgeon: Antony HasteMatthew Ramsey Eskridge, MD;  Location: [facility];  Service: Urology;  Laterality: N/A;  EXCISIONAL  BIOPSY   . HIP ARTHROPLASTY  2011   bilateral 1 month  . LUMBAR DISC SURGERY  2009  . TESTICULAR EXPLORATION Right 04/01/2013   Procedure: RIGHT INGUINAL EXPLORATION;  Surgeon: Antony HasteMatthew Ramsey Eskridge, MD;  Location: [facility];  Service: Urology;  Laterality: Right;  RIGHT INGUINAL EXPLORATION  . TOTAL KNEE ARTHROPLASTY  01/12/2012   Procedure: TOTAL KNEE ARTHROPLASTY;  Surgeon: Nestor LewandowskyFrank J Rowan, MD;  Location: [facility];  Service: Orthopedics;  Laterality: Right;  . TOTAL KNEE REVISION Right 05/02/2015  . TOTAL KNEE REVISION Right 05/02/2015   Procedure: ONE COMPONENT KNEE REVISION;  Surgeon: Gean BirchwoodFrank Rowan, MD;  Location: [facility];  Service: Orthopedics;  Laterality: Right;    Current Outpatient Rx  . Order #: [id number] Class: Historical Med  . Order #: [id number] Class: Historical Med  . Order #: [id number] Class: Historical Med  . Order #: [id number] Class: Print  . Order #: [id number] Class: Print  . Order #: [id number] Class: Print    Allergies Patient has no known allergies.  Family History  Problem Relation Age of Onset  . Breast cancer Mother   . Epilepsy Brother   . Breast cancer Other   . Epilepsy Other     Social History Social History   Tobacco Use  . Smoking status: Former Smoker    Last attempt to quit: 03/17/1996    Years since quitting: 22.0  . Smokeless tobacco: Never Used  . Tobacco comment: was a light, social smoker  Substance Use Topics  . Alcohol use: No  . Drug use: No    Review of Systems  All other systems negative except as documented in the HPI. All pertinent positives and negatives as reviewed in the HPI. ____________________________________________   PHYSICAL EXAM:  VITAL SIGNS: ED Triage Vitals  Enc Vitals Group     BP 04/02/18 1945 (!) 159/99     Pulse Rate 04/02/18 1945 98     Resp 04/02/18 1945 18     Temp 04/02/18 1945 99.5 F (37.5 C)     Temp Source 04/02/18 1945 Oral     SpO2 04/02/18 1945 99 %     Weight 04/02/18 1945 (!) 350 lb (158.8 kg)      Height 04/02/18 1945 6\' 4"  (1.93 m)    Constitutional: Alert and oriented. Well appearing and in no acute distress. Eyes: Conjunctivae are normal. PERRL. EOMI. Head: Atraumatic. Nose: No congestion/rhinnorhea. Mouth/Throat: Mucous membranes are moist.  Oropharynx non-erythematous. Neck: No stridor.  No meningeal signs.   Cardiovascular: Normal rate, regular rhythm. Good peripheral circulation. Systolic heart murmur present.   Respiratory: Normal respiratory effort.  No retractions. Lungs CTAB. Gastrointestinal: Soft and nontender. No distention.  Musculoskeletal: R>L edema. Right lower leg with significant amount erythema and swelling that is warm to touch and tender. No gross deformities of extremities. Neurologic:  Normal speech and language. No gross focal neurologic deficits are appreciated.  Skin:  Skin is warm, dry and intact. No rash noted.   ____________________________________________   LABS (all labs ordered are listed, but only abnormal results are displayed)  Labs Reviewed  CBC WITH DIFFERENTIAL/PLATELET - Abnormal; Notable for the following components:      Result Value   Hemoglobin 11.9 (*)    Platelets 132 (*)    All other components within normal limits  BASIC METABOLIC PANEL - Abnormal; Notable for the following components:   Potassium 3.0 (*)    Chloride 96 (*)    Glucose, Bld 127 (*)    Calcium 8.8 (*)    All other components within normal limits  TROPONIN I  BRAIN NATRIURETIC PEPTIDE   ____________________________________________  EKG   EKG Interpretation  Date/Time:  Saturday April 03 2018 00:57:16 EST Ventricular Rate:  85 PR Interval:    QRS Duration: 104 QT Interval:  397 QTC Calculation: 473 R Axis:   48 Text Interpretation:  Sinus rhythm Borderline prolonged PR interval Consider left atrial enlargement No significant change since last tracing Confirmed by Marily MemosMesner, Azayla Polo [phone]([id number]) on 04/03/2018 3:55:24 AM        ____________________________________________  RADIOLOGY  Dg Chest 2 View  Result Date: 04/03/2018 CLINICAL DATA:  Bilateral lower extremity edema. EXAM: CHEST - 2 VIEW COMPARISON:  04/19/2015 FINDINGS: Moderate thoracic spondylosis. Lateral view degraded by patient arm position. Lower cervical spine fixation. Midline trachea. Normal heart size and mediastinal contours. No pleural effusion or pneumothorax. Clear lungs. IMPRESSION: No acute cardiopulmonary disease. Electronically Signed   By: Jeronimo GreavesKyle  Talbot M.D.   On: 04/03/2018 00:59    ____________________________________________   PROCEDURES  Procedure(s) performed:   Procedures   ____________________________________________   INITIAL IMPRESSION / ASSESSMENT AND PLAN / ED COURSE  DVT versus cellulitis of his right lower extremity.  Bilateral lower extreme edema likely related to poor venous return.  No evidence for acute CHF exacerbation.  Given a dose of antibiotics and anticoagulants here and he will follow-[facility] for an [facility].  Patient given instructions as far as start antibiotics versus  Xarelto.  Also has low potassium so we will supplement that follow-up with his doctor.  Pertinent labs & imaging results that were available during my care of the patient were reviewed by me and considered in my medical decision making (see chart for details).  ____________________________________________  FINAL CLINICAL IMPRESSION(S) / ED DIAGNOSES  Final diagnoses:  Leg edema  Hypokalemia     MEDICATIONS GIVEN DURING THIS VISIT:  Medications  clindamycin (CLEOCIN) capsule 300 mg (300 mg Oral Given 04/03/18 0144)  enoxaparin (LOVENOX) injection 80 mg (80 mg Subcutaneous Given 04/03/18 0144)    And  enoxaparin (LOVENOX) injection 80 mg (80 mg Subcutaneous Given 04/03/18 0144)     NEW OUTPATIENT MEDICATIONS STARTED DURING THIS VISIT:  Discharge Medication List as of 04/03/2018  2:39 AM    START taking these  medications   Details  clindamycin (CLEOCIN) 300 MG capsule Take 1 capsule (300 mg total) by mouth 4 (four) times daily. X 7 days, Starting Sat 04/03/2018, Print    potassium chloride SA (K-DUR,KLOR-CON) 20 MEQ tablet Take 2 tablets (40 mEq total) by mouth daily for 7 days., Starting Sat 04/03/2018, Until Sat 04/10/2018, Print    Rivaroxaban 15 & 20 MG TBPK Take as directed on package: Start with one  tablet by mouth twice a day with food. On Day 22, switch to one  tablet once a day with food., Print        Note:  This note was prepared with assistance of Dragon voice recognition software. Occasional wrong-word or sound-a-like substitutions may have occurred due to the inherent limitations of voice recognition software.   Anamae Rochelle, Barbara Cower, MD 04/03/18 [phone]

## 2018-04-26 DIAGNOSIS — M25561 Pain in right knee: Secondary | ICD-10-CM | POA: Diagnosis not present

## 2018-04-26 DIAGNOSIS — Z79891 Long term (current) use of opiate analgesic: Secondary | ICD-10-CM | POA: Diagnosis not present

## 2018-04-26 DIAGNOSIS — M545 Low back pain: Secondary | ICD-10-CM | POA: Diagnosis not present

## 2018-04-26 DIAGNOSIS — G894 Chronic pain syndrome: Secondary | ICD-10-CM | POA: Diagnosis not present

## 2018-05-24 DIAGNOSIS — M545 Low back pain: Secondary | ICD-10-CM | POA: Diagnosis not present

## 2018-05-24 DIAGNOSIS — M25561 Pain in right knee: Secondary | ICD-10-CM | POA: Diagnosis not present

## 2018-05-24 DIAGNOSIS — Z79891 Long term (current) use of opiate analgesic: Secondary | ICD-10-CM | POA: Diagnosis not present

## 2018-05-24 DIAGNOSIS — G894 Chronic pain syndrome: Secondary | ICD-10-CM | POA: Diagnosis not present

## 2018-06-21 DIAGNOSIS — M545 Low back pain: Secondary | ICD-10-CM | POA: Diagnosis not present

## 2018-06-21 DIAGNOSIS — M25561 Pain in right knee: Secondary | ICD-10-CM | POA: Diagnosis not present

## 2018-06-21 DIAGNOSIS — G894 Chronic pain syndrome: Secondary | ICD-10-CM | POA: Diagnosis not present

## 2018-06-21 DIAGNOSIS — Z79891 Long term (current) use of opiate analgesic: Secondary | ICD-10-CM | POA: Diagnosis not present

## 2018-07-19 DIAGNOSIS — G894 Chronic pain syndrome: Secondary | ICD-10-CM | POA: Diagnosis not present

## 2018-07-19 DIAGNOSIS — M25561 Pain in right knee: Secondary | ICD-10-CM | POA: Diagnosis not present

## 2018-07-19 DIAGNOSIS — Z79891 Long term (current) use of opiate analgesic: Secondary | ICD-10-CM | POA: Diagnosis not present

## 2018-07-19 DIAGNOSIS — M545 Low back pain: Secondary | ICD-10-CM | POA: Diagnosis not present

## 2018-08-16 DIAGNOSIS — M25561 Pain in right knee: Secondary | ICD-10-CM | POA: Diagnosis not present

## 2018-08-16 DIAGNOSIS — G894 Chronic pain syndrome: Secondary | ICD-10-CM | POA: Diagnosis not present

## 2018-08-16 DIAGNOSIS — M545 Low back pain: Secondary | ICD-10-CM | POA: Diagnosis not present

## 2018-08-16 DIAGNOSIS — Z79891 Long term (current) use of opiate analgesic: Secondary | ICD-10-CM | POA: Diagnosis not present

## 2018-09-13 DIAGNOSIS — M545 Low back pain: Secondary | ICD-10-CM | POA: Diagnosis not present

## 2018-09-13 DIAGNOSIS — M25561 Pain in right knee: Secondary | ICD-10-CM | POA: Diagnosis not present

## 2018-09-13 DIAGNOSIS — G894 Chronic pain syndrome: Secondary | ICD-10-CM | POA: Diagnosis not present

## 2018-09-13 DIAGNOSIS — Z79891 Long term (current) use of opiate analgesic: Secondary | ICD-10-CM | POA: Diagnosis not present

## 2018-10-11 DIAGNOSIS — M25561 Pain in right knee: Secondary | ICD-10-CM | POA: Diagnosis not present

## 2018-10-11 DIAGNOSIS — M545 Low back pain: Secondary | ICD-10-CM | POA: Diagnosis not present

## 2018-10-11 DIAGNOSIS — G894 Chronic pain syndrome: Secondary | ICD-10-CM | POA: Diagnosis not present

## 2018-10-11 DIAGNOSIS — Z79891 Long term (current) use of opiate analgesic: Secondary | ICD-10-CM | POA: Diagnosis not present

## 2018-11-08 DIAGNOSIS — Z79891 Long term (current) use of opiate analgesic: Secondary | ICD-10-CM | POA: Diagnosis not present

## 2018-11-08 DIAGNOSIS — G894 Chronic pain syndrome: Secondary | ICD-10-CM | POA: Diagnosis not present

## 2018-11-08 DIAGNOSIS — M545 Low back pain: Secondary | ICD-10-CM | POA: Diagnosis not present

## 2018-11-08 DIAGNOSIS — M25561 Pain in right knee: Secondary | ICD-10-CM | POA: Diagnosis not present

## 2018-12-08 DIAGNOSIS — M545 Low back pain: Secondary | ICD-10-CM | POA: Diagnosis not present

## 2018-12-08 DIAGNOSIS — M25561 Pain in right knee: Secondary | ICD-10-CM | POA: Diagnosis not present

## 2018-12-08 DIAGNOSIS — Z79891 Long term (current) use of opiate analgesic: Secondary | ICD-10-CM | POA: Diagnosis not present

## 2018-12-08 DIAGNOSIS — G894 Chronic pain syndrome: Secondary | ICD-10-CM | POA: Diagnosis not present

## 2019-01-03 DIAGNOSIS — G894 Chronic pain syndrome: Secondary | ICD-10-CM | POA: Diagnosis not present

## 2019-01-03 DIAGNOSIS — Z79891 Long term (current) use of opiate analgesic: Secondary | ICD-10-CM | POA: Diagnosis not present

## 2019-01-03 DIAGNOSIS — M25561 Pain in right knee: Secondary | ICD-10-CM | POA: Diagnosis not present

## 2019-01-03 DIAGNOSIS — M545 Low back pain: Secondary | ICD-10-CM | POA: Diagnosis not present

## 2019-01-31 DIAGNOSIS — M25561 Pain in right knee: Secondary | ICD-10-CM | POA: Diagnosis not present

## 2019-01-31 DIAGNOSIS — G894 Chronic pain syndrome: Secondary | ICD-10-CM | POA: Diagnosis not present

## 2019-01-31 DIAGNOSIS — Z79891 Long term (current) use of opiate analgesic: Secondary | ICD-10-CM | POA: Diagnosis not present

## 2019-01-31 DIAGNOSIS — M545 Low back pain: Secondary | ICD-10-CM | POA: Diagnosis not present

## 2019-02-28 DIAGNOSIS — M25561 Pain in right knee: Secondary | ICD-10-CM | POA: Diagnosis not present

## 2019-02-28 DIAGNOSIS — Z79891 Long term (current) use of opiate analgesic: Secondary | ICD-10-CM | POA: Diagnosis not present

## 2019-02-28 DIAGNOSIS — G894 Chronic pain syndrome: Secondary | ICD-10-CM | POA: Diagnosis not present

## 2019-02-28 DIAGNOSIS — M545 Low back pain: Secondary | ICD-10-CM | POA: Diagnosis not present

## 2019-03-30 DIAGNOSIS — G894 Chronic pain syndrome: Secondary | ICD-10-CM | POA: Diagnosis not present

## 2019-03-30 DIAGNOSIS — M25561 Pain in right knee: Secondary | ICD-10-CM | POA: Diagnosis not present

## 2019-03-30 DIAGNOSIS — M545 Low back pain: Secondary | ICD-10-CM | POA: Diagnosis not present

## 2019-03-30 DIAGNOSIS — Z79891 Long term (current) use of opiate analgesic: Secondary | ICD-10-CM | POA: Diagnosis not present

## 2019-04-22 DIAGNOSIS — M79672 Pain in left foot: Secondary | ICD-10-CM | POA: Diagnosis not present

## 2019-04-22 DIAGNOSIS — L97512 Non-pressure chronic ulcer of other part of right foot with fat layer exposed: Secondary | ICD-10-CM | POA: Diagnosis not present

## 2019-04-22 DIAGNOSIS — M79671 Pain in right foot: Secondary | ICD-10-CM | POA: Diagnosis not present

## 2019-04-27 DIAGNOSIS — G894 Chronic pain syndrome: Secondary | ICD-10-CM | POA: Diagnosis not present

## 2019-04-27 DIAGNOSIS — Z79891 Long term (current) use of opiate analgesic: Secondary | ICD-10-CM | POA: Diagnosis not present

## 2019-04-27 DIAGNOSIS — M545 Low back pain: Secondary | ICD-10-CM | POA: Diagnosis not present

## 2019-04-27 DIAGNOSIS — M25561 Pain in right knee: Secondary | ICD-10-CM | POA: Diagnosis not present

## 2019-05-06 DIAGNOSIS — M79674 Pain in right toe(s): Secondary | ICD-10-CM | POA: Diagnosis not present

## 2019-05-06 DIAGNOSIS — L600 Ingrowing nail: Secondary | ICD-10-CM | POA: Diagnosis not present

## 2019-05-06 DIAGNOSIS — M79675 Pain in left toe(s): Secondary | ICD-10-CM | POA: Diagnosis not present

## 2019-05-06 DIAGNOSIS — M2041 Other hammer toe(s) (acquired), right foot: Secondary | ICD-10-CM | POA: Diagnosis not present

## 2019-05-06 DIAGNOSIS — M2042 Other hammer toe(s) (acquired), left foot: Secondary | ICD-10-CM | POA: Diagnosis not present

## 2019-05-06 DIAGNOSIS — B351 Tinea unguium: Secondary | ICD-10-CM | POA: Diagnosis not present

## 2019-05-20 DIAGNOSIS — L600 Ingrowing nail: Secondary | ICD-10-CM | POA: Diagnosis not present

## 2019-05-25 DIAGNOSIS — G894 Chronic pain syndrome: Secondary | ICD-10-CM | POA: Diagnosis not present

## 2019-05-25 DIAGNOSIS — Z79891 Long term (current) use of opiate analgesic: Secondary | ICD-10-CM | POA: Diagnosis not present

## 2019-05-25 DIAGNOSIS — M25561 Pain in right knee: Secondary | ICD-10-CM | POA: Diagnosis not present

## 2019-05-25 DIAGNOSIS — M545 Low back pain: Secondary | ICD-10-CM | POA: Diagnosis not present

## 2019-06-03 DIAGNOSIS — M79671 Pain in right foot: Secondary | ICD-10-CM | POA: Diagnosis not present

## 2019-06-03 DIAGNOSIS — L600 Ingrowing nail: Secondary | ICD-10-CM | POA: Diagnosis not present

## 2019-06-03 DIAGNOSIS — M79672 Pain in left foot: Secondary | ICD-10-CM | POA: Diagnosis not present

## 2019-06-03 DIAGNOSIS — B351 Tinea unguium: Secondary | ICD-10-CM | POA: Diagnosis not present

## 2019-06-27 DIAGNOSIS — Z79891 Long term (current) use of opiate analgesic: Secondary | ICD-10-CM | POA: Diagnosis not present

## 2019-06-27 DIAGNOSIS — M545 Low back pain: Secondary | ICD-10-CM | POA: Diagnosis not present

## 2019-06-27 DIAGNOSIS — G894 Chronic pain syndrome: Secondary | ICD-10-CM | POA: Diagnosis not present

## 2019-06-27 DIAGNOSIS — M25561 Pain in right knee: Secondary | ICD-10-CM | POA: Diagnosis not present

## 2019-07-25 DIAGNOSIS — G894 Chronic pain syndrome: Secondary | ICD-10-CM | POA: Diagnosis not present

## 2019-07-25 DIAGNOSIS — Z79891 Long term (current) use of opiate analgesic: Secondary | ICD-10-CM | POA: Diagnosis not present

## 2019-07-25 DIAGNOSIS — M25561 Pain in right knee: Secondary | ICD-10-CM | POA: Diagnosis not present

## 2019-07-25 DIAGNOSIS — M545 Low back pain: Secondary | ICD-10-CM | POA: Diagnosis not present

## 2019-10-17 DIAGNOSIS — M25471 Effusion, right ankle: Secondary | ICD-10-CM | POA: Diagnosis not present

## 2019-10-17 DIAGNOSIS — M150 Primary generalized (osteo)arthritis: Secondary | ICD-10-CM | POA: Diagnosis not present

## 2019-10-17 DIAGNOSIS — M5136 Other intervertebral disc degeneration, lumbar region: Secondary | ICD-10-CM | POA: Diagnosis not present

## 2019-10-17 DIAGNOSIS — M25472 Effusion, left ankle: Secondary | ICD-10-CM | POA: Diagnosis not present

## 2019-10-17 DIAGNOSIS — M0609 Rheumatoid arthritis without rheumatoid factor, multiple sites: Secondary | ICD-10-CM | POA: Diagnosis not present

## 2019-10-17 DIAGNOSIS — Z79899 Other long term (current) drug therapy: Secondary | ICD-10-CM | POA: Diagnosis not present

## 2019-10-17 DIAGNOSIS — Z111 Encounter for screening for respiratory tuberculosis: Secondary | ICD-10-CM | POA: Diagnosis not present

## 2019-10-17 DIAGNOSIS — R5383 Other fatigue: Secondary | ICD-10-CM | POA: Diagnosis not present

## 2019-10-17 DIAGNOSIS — G894 Chronic pain syndrome: Secondary | ICD-10-CM | POA: Diagnosis not present

## 2019-11-01 DIAGNOSIS — M25471 Effusion, right ankle: Secondary | ICD-10-CM | POA: Diagnosis not present

## 2019-11-01 DIAGNOSIS — M25472 Effusion, left ankle: Secondary | ICD-10-CM | POA: Diagnosis not present

## 2019-11-01 DIAGNOSIS — D869 Sarcoidosis, unspecified: Secondary | ICD-10-CM | POA: Diagnosis not present

## 2019-11-01 DIAGNOSIS — M150 Primary generalized (osteo)arthritis: Secondary | ICD-10-CM | POA: Diagnosis not present

## 2019-11-01 DIAGNOSIS — M0609 Rheumatoid arthritis without rheumatoid factor, multiple sites: Secondary | ICD-10-CM | POA: Diagnosis not present

## 2019-11-01 DIAGNOSIS — Z79899 Other long term (current) drug therapy: Secondary | ICD-10-CM | POA: Diagnosis not present

## 2019-11-01 DIAGNOSIS — G894 Chronic pain syndrome: Secondary | ICD-10-CM | POA: Diagnosis not present

## 2019-11-01 DIAGNOSIS — M2550 Pain in unspecified joint: Secondary | ICD-10-CM | POA: Diagnosis not present

## 2019-11-01 DIAGNOSIS — M5136 Other intervertebral disc degeneration, lumbar region: Secondary | ICD-10-CM | POA: Diagnosis not present

## 2019-11-16 DIAGNOSIS — B079 Viral wart, unspecified: Secondary | ICD-10-CM | POA: Diagnosis not present

## 2019-11-16 DIAGNOSIS — L600 Ingrowing nail: Secondary | ICD-10-CM | POA: Diagnosis not present

## 2019-11-16 DIAGNOSIS — M79675 Pain in left toe(s): Secondary | ICD-10-CM | POA: Diagnosis not present

## 2019-11-16 DIAGNOSIS — M79674 Pain in right toe(s): Secondary | ICD-10-CM | POA: Diagnosis not present

## 2019-11-16 DIAGNOSIS — B351 Tinea unguium: Secondary | ICD-10-CM | POA: Diagnosis not present

## 2019-12-07 DIAGNOSIS — B079 Viral wart, unspecified: Secondary | ICD-10-CM | POA: Diagnosis not present

## 2019-12-20 DIAGNOSIS — M5137 Other intervertebral disc degeneration, lumbosacral region: Secondary | ICD-10-CM | POA: Diagnosis not present

## 2019-12-20 DIAGNOSIS — G894 Chronic pain syndrome: Secondary | ICD-10-CM | POA: Diagnosis not present

## 2019-12-20 DIAGNOSIS — M069 Rheumatoid arthritis, unspecified: Secondary | ICD-10-CM | POA: Diagnosis not present

## 2019-12-20 DIAGNOSIS — M5440 Lumbago with sciatica, unspecified side: Secondary | ICD-10-CM | POA: Diagnosis not present

## 2019-12-23 DIAGNOSIS — M5137 Other intervertebral disc degeneration, lumbosacral region: Secondary | ICD-10-CM | POA: Diagnosis not present

## 2019-12-23 DIAGNOSIS — M47816 Spondylosis without myelopathy or radiculopathy, lumbar region: Secondary | ICD-10-CM | POA: Diagnosis not present

## 2019-12-23 DIAGNOSIS — M5186 Other intervertebral disc disorders, lumbar region: Secondary | ICD-10-CM | POA: Diagnosis not present

## 2019-12-23 DIAGNOSIS — M545 Low back pain, unspecified: Secondary | ICD-10-CM | POA: Diagnosis not present

## 2019-12-23 DIAGNOSIS — M5136 Other intervertebral disc degeneration, lumbar region: Secondary | ICD-10-CM | POA: Diagnosis not present

## 2019-12-29 DIAGNOSIS — R270 Ataxia, unspecified: Secondary | ICD-10-CM | POA: Diagnosis not present

## 2020-01-04 DIAGNOSIS — M150 Primary generalized (osteo)arthritis: Secondary | ICD-10-CM | POA: Diagnosis not present

## 2020-01-04 DIAGNOSIS — D869 Sarcoidosis, unspecified: Secondary | ICD-10-CM | POA: Diagnosis not present

## 2020-01-04 DIAGNOSIS — G8929 Other chronic pain: Secondary | ICD-10-CM | POA: Diagnosis not present

## 2020-01-04 DIAGNOSIS — M0609 Rheumatoid arthritis without rheumatoid factor, multiple sites: Secondary | ICD-10-CM | POA: Diagnosis not present

## 2020-01-04 DIAGNOSIS — G894 Chronic pain syndrome: Secondary | ICD-10-CM | POA: Diagnosis not present

## 2020-01-04 DIAGNOSIS — Z6841 Body Mass Index (BMI) 40.0 and over, adult: Secondary | ICD-10-CM | POA: Diagnosis not present

## 2020-01-04 DIAGNOSIS — M533 Sacrococcygeal disorders, not elsewhere classified: Secondary | ICD-10-CM | POA: Diagnosis not present

## 2020-01-04 DIAGNOSIS — M5136 Other intervertebral disc degeneration, lumbar region: Secondary | ICD-10-CM | POA: Diagnosis not present

## 2020-01-04 DIAGNOSIS — Z79899 Other long term (current) drug therapy: Secondary | ICD-10-CM | POA: Diagnosis not present

## 2020-01-09 DIAGNOSIS — R270 Ataxia, unspecified: Secondary | ICD-10-CM | POA: Diagnosis not present

## 2020-01-09 DIAGNOSIS — M5031 Other cervical disc degeneration,  high cervical region: Secondary | ICD-10-CM | POA: Diagnosis not present

## 2020-01-09 DIAGNOSIS — R2681 Unsteadiness on feet: Secondary | ICD-10-CM | POA: Diagnosis not present

## 2020-01-09 DIAGNOSIS — M4184 Other forms of scoliosis, thoracic region: Secondary | ICD-10-CM | POA: Diagnosis not present

## 2020-01-09 DIAGNOSIS — Z981 Arthrodesis status: Secondary | ICD-10-CM | POA: Diagnosis not present

## 2020-01-09 DIAGNOSIS — M47812 Spondylosis without myelopathy or radiculopathy, cervical region: Secondary | ICD-10-CM | POA: Diagnosis not present

## 2020-01-19 DIAGNOSIS — G959 Disease of spinal cord, unspecified: Secondary | ICD-10-CM | POA: Diagnosis not present

## 2020-01-19 DIAGNOSIS — Z6841 Body Mass Index (BMI) 40.0 and over, adult: Secondary | ICD-10-CM | POA: Diagnosis not present

## 2020-01-19 DIAGNOSIS — R030 Elevated blood-pressure reading, without diagnosis of hypertension: Secondary | ICD-10-CM | POA: Diagnosis not present

## 2020-01-20 ENCOUNTER — Other Ambulatory Visit: Payer: Self-pay | Admitting: Neurosurgery

## 2020-01-31 NOTE — Pre-Procedure Instructions (Signed)
[id number] - [place], [place] - [address] OF [address] [place] [address] Phone: [phone] Fax: [phone]      Your procedure is scheduled on Friday, November 19th.  Report to [facility] Main Entrance "A" at 9:15 A.M., and check in at the Admitting office.  Call this number if you have problems the morning of surgery:  [phone]  Call [phone] if you have any questions prior to your surgery date Monday-Friday 8am-4pm    Remember:  Do not eat or drink after midnight the night before your surgery    Take these medicines the morning of surgery with A SIP OF WATER  oxycodone (ROXICODONE)   As of today, STOP taking any Aspirin (unless otherwise instructed by your surgeon) Aleve, Naproxen, Ibuprofen, Motrin, Advil, Goody's, BC's, all herbal medications, fish oil, and all vitamins.                      Do not wear jewelry.            Do not wear lotions, powders, colognes, or deodorant.            Men may shave face and neck.            Do not bring valuables to the hospital.            [facility] is not responsible for any belongings or valuables.  Do NOT Smoke (Tobacco/Vaping) or drink Alcohol 24 hours prior to your procedure If you use a CPAP at night, you may bring all equipment for your overnight stay.   Contacts, glasses, dentures or bridgework may not be worn into surgery.      For patients admitted to the hospital, discharge time will be determined by your treatment team.   Patients discharged the day of surgery will not be allowed to drive home, and someone needs to stay with them for 24 hours.    Special instructions:   [facility]- Preparing For Surgery  Before surgery, you can play an important role. Because skin is not sterile, your skin needs to be as free of germs as possible. You can reduce the number of germs on your skin by washing with CHG (chlorahexidine gluconate) Soap  before surgery.  CHG is an antiseptic cleaner which kills germs and bonds with the skin to continue killing germs even after washing.    Oral Hygiene is also important to reduce your risk of infection.  Remember - BRUSH YOUR TEETH THE MORNING OF SURGERY WITH YOUR REGULAR TOOTHPASTE  Please do not use if you have an allergy to CHG or antibacterial soaps. If your skin becomes reddened/irritated stop using the CHG.  Do not shave (including legs and underarms) for at least 48 hours prior to first CHG shower. It is OK to shave your face.  Please follow these instructions carefully.   1. Shower the NIGHT BEFORE SURGERY and the MORNING OF SURGERY with CHG Soap.   2. If you chose to wash your hair, wash your hair first as usual with your normal shampoo.  3. After you shampoo, rinse your hair and body thoroughly to remove the shampoo.  4. Use CHG as you would any other liquid soap. You can apply CHG directly to the skin and wash gently with a scrungie or a clean washcloth.   5. Apply the CHG Soap to your body ONLY FROM THE NECK DOWN.  Do not use   on open wounds or open sores. Avoid contact with your eyes, ears, mouth and genitals (private parts). Wash Face and genitals (private parts)  with your normal soap.   6. Wash thoroughly, paying special attention to the area where your surgery will be performed.  7. Thoroughly rinse your body with warm water from the neck down.  8. DO NOT shower/wash with your normal soap after using and rinsing off the CHG Soap.  9. Pat yourself dry with a CLEAN TOWEL.  10. Wear CLEAN PAJAMAS to bed the night before surgery  11. Place CLEAN SHEETS on your bed the night of your first shower and DO NOT SLEEP WITH PETS.   Day of Surgery: Wear Clean/Comfortable clothing the morning of surgery Do not apply any deodorants/lotions.   Remember to brush your teeth WITH YOUR REGULAR TOOTHPASTE.   Please read over the following fact sheets that you were given.

## 2020-01-31 NOTE — Pre-Procedure Instructions (Deleted)
[facility] [phone] - [place], [place] - [address] AT [facility] [address] [place] [address] Phone: [phone] Fax: [phone]      Your procedure is scheduled on Friday, November 19th.  Report to [facility] Main Entrance "A" at 9:15 A.M., and check in at the Admitting office.  Call this number if you have problems the morning of surgery:  [phone]  Call [phone] if you have any questions prior to your surgery date Monday-Friday 8am-4pm    Remember:  Do not eat or drink after midnight the night before your surgery    Take these medicines the morning of surgery with A SIP OF WATER  oxycodone (ROXICODONE)   As of today, STOP taking any Aspirin (unless otherwise instructed by your surgeon) Aleve, Naproxen, Ibuprofen, Motrin, Advil, Goody's, BC's, all herbal medications, fish oil, and all vitamins.                      Do not wear jewelry.            Do not wear lotions, powders, colognes, or deodorant.            Men may shave face and neck.            Do not bring valuables to the hospital.            [facility] is not responsible for any belongings or valuables.  Do NOT Smoke (Tobacco/Vaping) or drink Alcohol 24 hours prior to your procedure If you use a CPAP at night, you may bring all equipment for your overnight stay.   Contacts, glasses, dentures or bridgework may not be worn into surgery.      For patients admitted to the hospital, discharge time will be determined by your treatment team.   Patients discharged the day of surgery will not be allowed to drive home, and someone needs to stay with them for 24 hours.    Special instructions:   [facility]- Preparing For Surgery  Before surgery, you can play an important role. Because skin is not sterile, your skin needs to be as free of germs as possible. You can reduce the number of germs on your skin by washing with CHG (chlorahexidine gluconate) Soap  before surgery.  CHG is an antiseptic cleaner which kills germs and bonds with the skin to continue killing germs even after washing.    Oral Hygiene is also important to reduce your risk of infection.  Remember - BRUSH YOUR TEETH THE MORNING OF SURGERY WITH YOUR REGULAR TOOTHPASTE  Please do not use if you have an allergy to CHG or antibacterial soaps. If your skin becomes reddened/irritated stop using the CHG.  Do not shave (including legs and underarms) for at least 48 hours prior to first CHG shower. It is OK to shave your face.  Please follow these instructions carefully.   1. Shower the NIGHT BEFORE SURGERY and the MORNING OF SURGERY with CHG Soap.   2. If you chose to wash your hair, wash your hair first as usual with your normal shampoo.  3. After you shampoo, rinse your hair and body thoroughly to remove the shampoo.  4. Use CHG as you would any other liquid soap. You can apply CHG directly to the skin and wash gently with a scrungie or a clean washcloth.   5. Apply the CHG Soap to your body ONLY FROM THE NECK DOWN.  Do not use  on open wounds or open sores. Avoid contact with your eyes, ears, mouth and genitals (private parts). Wash Face and genitals (private parts)  with your normal soap.   6. Wash thoroughly, paying special attention to the area where your surgery will be performed.  7. Thoroughly rinse your body with warm water from the neck down.  8. DO NOT shower/wash with your normal soap after using and rinsing off the CHG Soap.  9. Pat yourself dry with a CLEAN TOWEL.  10. Wear CLEAN PAJAMAS to bed the night before surgery  11. Place CLEAN SHEETS on your bed the night of your first shower and DO NOT SLEEP WITH PETS.   Day of Surgery: Wear Clean/Comfortable clothing the morning of surgery Do not apply any deodorants/lotions.   Remember to brush your teeth WITH YOUR REGULAR TOOTHPASTE.   Please read over the following fact sheets that you were given.

## 2020-02-01 ENCOUNTER — Encounter (HOSPITAL_COMMUNITY): Payer: Self-pay

## 2020-02-01 ENCOUNTER — Other Ambulatory Visit: Payer: Self-pay

## 2020-02-01 ENCOUNTER — Other Ambulatory Visit (HOSPITAL_COMMUNITY)
Admission: RE | Admit: 2020-02-01 | Discharge: 2020-02-01 | Disposition: A | Discharge status: Home / Self Care | Payer: Medicare HMO | Source: Ambulatory Visit | Attending: Neurosurgery | Admitting: Neurosurgery

## 2020-02-01 ENCOUNTER — Encounter (HOSPITAL_COMMUNITY)
Admission: RE | Admit: 2020-02-01 | Discharge: 2020-02-01 | Disposition: A | Discharge status: Home / Self Care | Payer: Medicare HMO | Source: Ambulatory Visit | Attending: Neurosurgery | Admitting: Neurosurgery

## 2020-02-01 DIAGNOSIS — Z01812 Encounter for preprocedural laboratory examination: Secondary | ICD-10-CM | POA: Insufficient documentation

## 2020-02-01 DIAGNOSIS — Z20822 Contact with and (suspected) exposure to covid-19: Secondary | ICD-10-CM | POA: Insufficient documentation

## 2020-02-01 DIAGNOSIS — M5000 Cervical disc disorder with myelopathy, unspecified cervical region: Secondary | ICD-10-CM | POA: Diagnosis not present

## 2020-02-01 LAB — BASIC METABOLIC PANEL
Anion gap: 13 (ref 5–15)
BUN: 7 mg/dL (ref 6–20)
CO2: 21 mmol/L — ABNORMAL LOW (ref 22–32)
Calcium: 10 mg/dL (ref 8.9–10.3)
Chloride: 105 mmol/L (ref 98–111)
Creatinine, Ser: 0.75 mg/dL (ref 0.61–1.24)
GFR, Estimated: >60 mL/min (ref >60)
Glucose, Bld: 108 mg/dL — ABNORMAL HIGH (ref 70–99)
Potassium: 3.5 mmol/L (ref 3.5–5.1)
Sodium: 139 mmol/L (ref 135–145)

## 2020-02-01 LAB — CBC
HCT: 49.8 % (ref 39.0–52.0)
Hemoglobin: 15.3 g/dL (ref 13.0–17.0)
MCH: 26.2 pg (ref 26.0–34.0)
MCHC: 30.7 g/dL (ref 30.0–36.0)
MCV: 85.4 fL (ref 80.0–100.0)
Platelets: 146 10*3/uL — ABNORMAL LOW (ref 150–400)
RBC: 5.83 MIL/uL — ABNORMAL HIGH (ref 4.22–5.81)
RDW: 14.4 % (ref 11.5–15.5)
WBC: 6.1 10*3/uL (ref 4.0–10.5)
nRBC: 0 % (ref 0.0–0.2)

## 2020-02-01 LAB — TYPE AND SCREEN
ABO/RH(D): O NEG
Antibody Screen: NEGATIVE

## 2020-02-01 LAB — SURGICAL PCR SCREEN
MRSA, PCR: NEGATIVE
Staphylococcus aureus: NEGATIVE

## 2020-02-01 LAB — SARS CORONAVIRUS 2 (TAT 6-24 HRS): SARS Coronavirus 2: NEGATIVE

## 2020-02-01 NOTE — Progress Notes (Signed)
PCP - Dr. Lupe Carney Cardiologist - Denies  Chest x-ray - Not indicated EKG - Not indicated Stress Test - Denies ECHO - 07/22/16 Cardiac Cath -Denies   Sleep Study - Years ago Has OSA CPAP - Not using  DM - Denies  COVID TEST- 02/01/20   Anesthesia review: No   Patient denies shortness of breath, fever, cough and chest pain at PAT appointment   All instructions explained to the patient, with a verbal understanding of the material. Patient agrees to go over the instructions while at home for a better understanding. Patient also instructed to self quarantine after being tested for COVID-19. The opportunity to ask questions was provided.

## 2020-02-02 MED ORDER — DEXTROSE 5 % IV SOLN
3.0000 g | INTRAVENOUS | Status: DC
  Filled 2020-02-02: qty 3000

## 2020-02-03 ENCOUNTER — Observation Stay (HOSPITAL_COMMUNITY)
Admission: RE | Admit: 2020-02-03 | Discharge: 2020-02-04 | Disposition: A | Discharge status: Home / Self Care | Payer: Medicare HMO | Attending: Neurosurgery | Admitting: Neurosurgery

## 2020-02-03 ENCOUNTER — Encounter (HOSPITAL_COMMUNITY): Payer: Self-pay | Admitting: Neurosurgery

## 2020-02-03 ENCOUNTER — Ambulatory Visit (HOSPITAL_COMMUNITY): Payer: Medicare HMO

## 2020-02-03 ENCOUNTER — Encounter (HOSPITAL_COMMUNITY)
Admission: RE | Disposition: A | Discharge status: Home / Self Care | Payer: Self-pay | Source: Home / Self Care | Attending: Neurosurgery

## 2020-02-03 ENCOUNTER — Ambulatory Visit (HOSPITAL_COMMUNITY): Payer: Medicare HMO | Admitting: Certified Registered Nurse Anesthetist

## 2020-02-03 ENCOUNTER — Other Ambulatory Visit: Payer: Self-pay

## 2020-02-03 DIAGNOSIS — G4730 Sleep apnea, unspecified: Secondary | ICD-10-CM | POA: Diagnosis not present

## 2020-02-03 DIAGNOSIS — Z79899 Other long term (current) drug therapy: Secondary | ICD-10-CM | POA: Diagnosis not present

## 2020-02-03 DIAGNOSIS — Z87891 Personal history of nicotine dependence: Secondary | ICD-10-CM | POA: Insufficient documentation

## 2020-02-03 DIAGNOSIS — Z96643 Presence of artificial hip joint, bilateral: Secondary | ICD-10-CM | POA: Diagnosis not present

## 2020-02-03 DIAGNOSIS — Z981 Arthrodesis status: Secondary | ICD-10-CM | POA: Diagnosis not present

## 2020-02-03 DIAGNOSIS — M4712 Other spondylosis with myelopathy, cervical region: Secondary | ICD-10-CM | POA: Diagnosis not present

## 2020-02-03 DIAGNOSIS — Z96651 Presence of right artificial knee joint: Secondary | ICD-10-CM | POA: Diagnosis not present

## 2020-02-03 DIAGNOSIS — M4802 Spinal stenosis, cervical region: Principal | ICD-10-CM | POA: Diagnosis present

## 2020-02-03 DIAGNOSIS — J449 Chronic obstructive pulmonary disease, unspecified: Secondary | ICD-10-CM | POA: Diagnosis not present

## 2020-02-03 DIAGNOSIS — Z419 Encounter for procedure for purposes other than remedying health state, unspecified: Secondary | ICD-10-CM

## 2020-02-03 DIAGNOSIS — M542 Cervicalgia: Secondary | ICD-10-CM | POA: Diagnosis present

## 2020-02-03 DIAGNOSIS — M5106 Intervertebral disc disorders with myelopathy, lumbar region: Secondary | ICD-10-CM | POA: Diagnosis not present

## 2020-02-03 HISTORY — PX: ANTERIOR CERVICAL DECOMP/DISCECTOMY FUSION: SHX1161

## 2020-02-03 SURGERY — ANTERIOR CERVICAL DECOMPRESSION/DISCECTOMY FUSION 2 LEVELS
Anesthesia: General

## 2020-02-03 MED ORDER — CHLORHEXIDINE GLUCONATE 0.12 % MT SOLN: 15.0000 mL | Freq: Once | OROMUCOSAL | Status: AC

## 2020-02-03 MED ORDER — 0.9 % SODIUM CHLORIDE (POUR BTL) OPTIME
TOPICAL | Status: DC | PRN
  Administered 2020-02-03 (×2): 1000 mL

## 2020-02-03 MED ORDER — SODIUM CHLORIDE 0.9 % IV SOLN: 250.0000 mL | INTRAVENOUS | Status: DC

## 2020-02-03 MED ORDER — FENTANYL CITRATE (PF) 100 MCG/2ML IJ SOLN
INTRAMUSCULAR | Status: AC
  Filled 2020-02-03: qty 2

## 2020-02-03 MED ORDER — PHENOL 1.4 % MT LIQD: 1.0000 | OROMUCOSAL | Status: DC | PRN

## 2020-02-03 MED ORDER — CHLORHEXIDINE GLUCONATE 0.12 % MT SOLN
OROMUCOSAL | Status: AC
  Administered 2020-02-03: 15 mL via OROMUCOSAL
  Filled 2020-02-03: qty 15

## 2020-02-03 MED ORDER — FENTANYL CITRATE (PF) 250 MCG/5ML IJ SOLN
INTRAMUSCULAR | Status: AC
  Filled 2020-02-03: qty 5

## 2020-02-03 MED ORDER — GABAPENTIN 300 MG PO CAPS: 300.0000 mg | ORAL_CAPSULE | Freq: Every day | ORAL | Status: DC

## 2020-02-03 MED ORDER — HEMOSTATIC AGENTS (NO CHARGE) OPTIME
TOPICAL | Status: DC | PRN
  Administered 2020-02-03: 1 via TOPICAL

## 2020-02-03 MED ORDER — OXYCODONE HCL 5 MG PO TABS
30.0000 mg | ORAL_TABLET | Freq: Three times a day (TID) | ORAL | Status: DC
  Administered 2020-02-03 – 2020-02-04 (×3): 30 mg via ORAL
  Filled 2020-02-03 (×3): qty 6

## 2020-02-03 MED ORDER — ACETAMINOPHEN 10 MG/ML IV SOLN
INTRAVENOUS | Status: DC | PRN
  Administered 2020-02-03: 1000 mg via INTRAVENOUS

## 2020-02-03 MED ORDER — ACETAMINOPHEN 325 MG PO TABS
650.0000 mg | ORAL_TABLET | ORAL | Status: DC | PRN
  Filled 2020-02-03: qty 2

## 2020-02-03 MED ORDER — PHENYLEPHRINE HCL (PRESSORS) 10 MG/ML IV SOLN
INTRAVENOUS | Status: DC | PRN
  Administered 2020-02-03: 80 ug via INTRAVENOUS

## 2020-02-03 MED ORDER — DEXAMETHASONE SODIUM PHOSPHATE 10 MG/ML IJ SOLN
INTRAMUSCULAR | Status: DC | PRN
  Administered 2020-02-03: 5 mg via INTRAVENOUS

## 2020-02-03 MED ORDER — DEXAMETHASONE SODIUM PHOSPHATE 10 MG/ML IJ SOLN: 10.0000 mg | Freq: Once | INTRAMUSCULAR | Status: DC

## 2020-02-03 MED ORDER — DEXAMETHASONE SODIUM PHOSPHATE 10 MG/ML IJ SOLN
INTRAMUSCULAR | Status: AC
  Filled 2020-02-03: qty 1

## 2020-02-03 MED ORDER — LACTATED RINGERS IV SOLN: INTRAVENOUS | Status: DC | PRN

## 2020-02-03 MED ORDER — PROPOFOL 10 MG/ML IV BOLUS
INTRAVENOUS | Status: DC | PRN
  Administered 2020-02-03: 200 mg via INTRAVENOUS

## 2020-02-03 MED ORDER — ONDANSETRON HCL 4 MG/2ML IJ SOLN: 4.0000 mg | Freq: Four times a day (QID) | INTRAMUSCULAR | Status: DC | PRN

## 2020-02-03 MED ORDER — CHLORHEXIDINE GLUCONATE CLOTH 2 % EX PADS: 6.0000 | MEDICATED_PAD | Freq: Once | CUTANEOUS | Status: DC

## 2020-02-03 MED ORDER — LIDOCAINE HCL (PF) 2 % IJ SOLN
INTRAMUSCULAR | Status: AC
  Filled 2020-02-03: qty 10

## 2020-02-03 MED ORDER — FENTANYL CITRATE (PF) 100 MCG/2ML IJ SOLN
25.0000 ug | INTRAMUSCULAR | Status: DC | PRN
  Administered 2020-02-03 (×4): 50 ug via INTRAVENOUS

## 2020-02-03 MED ORDER — HYDROXYCHLOROQUINE SULFATE 200 MG PO TABS
200.0000 mg | ORAL_TABLET | Freq: Two times a day (BID) | ORAL | Status: DC
  Filled 2020-02-03: qty 1

## 2020-02-03 MED ORDER — MIDAZOLAM HCL 5 MG/5ML IJ SOLN
INTRAMUSCULAR | Status: DC | PRN
  Administered 2020-02-03: 2 mg via INTRAVENOUS

## 2020-02-03 MED ORDER — LACTATED RINGERS IV SOLN: INTRAVENOUS | Status: DC

## 2020-02-03 MED ORDER — DEXMEDETOMIDINE (PRECEDEX) IN NS 20 MCG/5ML (4 MCG/ML) IV SYRINGE
PREFILLED_SYRINGE | INTRAVENOUS | Status: DC | PRN
  Administered 2020-02-03: 8 ug via INTRAVENOUS

## 2020-02-03 MED ORDER — FENTANYL CITRATE (PF) 250 MCG/5ML IJ SOLN
INTRAMUSCULAR | Status: DC | PRN
  Administered 2020-02-03 (×2): 50 ug via INTRAVENOUS
  Administered 2020-02-03: 150 ug via INTRAVENOUS

## 2020-02-03 MED ORDER — PHENYLEPHRINE 40 MCG/ML (10ML) SYRINGE FOR IV PUSH (FOR BLOOD PRESSURE SUPPORT)
PREFILLED_SYRINGE | INTRAVENOUS | Status: AC
  Filled 2020-02-03: qty 30

## 2020-02-03 MED ORDER — ACETAMINOPHEN 10 MG/ML IV SOLN
INTRAVENOUS | Status: AC
  Filled 2020-02-03: qty 100

## 2020-02-03 MED ORDER — THROMBIN 5000 UNITS EX SOLR
CUTANEOUS | Status: AC
  Filled 2020-02-03: qty 15000

## 2020-02-03 MED ORDER — SUGAMMADEX SODIUM 200 MG/2ML IV SOLN
INTRAVENOUS | Status: DC | PRN
  Administered 2020-02-03: 400 mg via INTRAVENOUS

## 2020-02-03 MED ORDER — ACETAMINOPHEN 10 MG/ML IV SOLN: 1000.0000 mg | Freq: Once | INTRAVENOUS | Status: DC | PRN

## 2020-02-03 MED ORDER — MIDAZOLAM HCL 2 MG/2ML IJ SOLN
INTRAMUSCULAR | Status: AC
  Filled 2020-02-03: qty 2

## 2020-02-03 MED ORDER — ACETAMINOPHEN 160 MG/5ML PO SOLN: 1000.0000 mg | Freq: Once | ORAL | Status: DC | PRN

## 2020-02-03 MED ORDER — HYDROMORPHONE HCL 1 MG/ML IJ SOLN
1.0000 mg | INTRAMUSCULAR | Status: DC | PRN
  Administered 2020-02-04: 1 mg via INTRAVENOUS
  Filled 2020-02-03: qty 1

## 2020-02-03 MED ORDER — METHOCARBAMOL 500 MG PO TABS
500.0000 mg | ORAL_TABLET | Freq: Four times a day (QID) | ORAL | Status: DC | PRN
  Administered 2020-02-03 – 2020-02-04 (×2): 500 mg via ORAL
  Filled 2020-02-03 (×2): qty 1

## 2020-02-03 MED ORDER — OXYCODONE HCL 5 MG PO TABS: 5.0000 mg | ORAL_TABLET | Freq: Once | ORAL | Status: DC | PRN

## 2020-02-03 MED ORDER — PROPOFOL 10 MG/ML IV BOLUS
INTRAVENOUS | Status: AC
  Filled 2020-02-03: qty 20

## 2020-02-03 MED ORDER — OXYCODONE HCL 5 MG/5ML PO SOLN: 5.0000 mg | Freq: Once | ORAL | Status: DC | PRN

## 2020-02-03 MED ORDER — CEFAZOLIN SODIUM-DEXTROSE 2-4 GM/100ML-% IV SOLN
2.0000 g | Freq: Three times a day (TID) | INTRAVENOUS | Status: AC
  Administered 2020-02-03 – 2020-02-04 (×2): 2 g via INTRAVENOUS
  Filled 2020-02-03 (×2): qty 100

## 2020-02-03 MED ORDER — METHOCARBAMOL 1000 MG/10ML IJ SOLN
500.0000 mg | Freq: Four times a day (QID) | INTRAVENOUS | Status: DC | PRN
  Filled 2020-02-03: qty 5

## 2020-02-03 MED ORDER — SODIUM CHLORIDE 0.9% FLUSH: 3.0000 mL | INTRAVENOUS | Status: DC | PRN

## 2020-02-03 MED ORDER — SUGAMMADEX SODIUM 500 MG/5ML IV SOLN
INTRAVENOUS | Status: AC
  Filled 2020-02-03: qty 10

## 2020-02-03 MED ORDER — PHENYLEPHRINE HCL-NACL 10-0.9 MG/250ML-% IV SOLN
INTRAVENOUS | Status: DC | PRN
  Administered 2020-02-03: 25 ug/min via INTRAVENOUS

## 2020-02-03 MED ORDER — ONDANSETRON HCL 4 MG/2ML IJ SOLN
INTRAMUSCULAR | Status: AC
  Filled 2020-02-03: qty 4

## 2020-02-03 MED ORDER — DEXAMETHASONE SODIUM PHOSPHATE 10 MG/ML IJ SOLN
INTRAMUSCULAR | Status: AC
  Filled 2020-02-03: qty 2

## 2020-02-03 MED ORDER — ORAL CARE MOUTH RINSE: 15.0000 mL | Freq: Once | OROMUCOSAL | Status: AC

## 2020-02-03 MED ORDER — ONDANSETRON HCL 4 MG PO TABS: 4.0000 mg | ORAL_TABLET | Freq: Four times a day (QID) | ORAL | Status: DC | PRN

## 2020-02-03 MED ORDER — ONDANSETRON HCL 4 MG/2ML IJ SOLN
INTRAMUSCULAR | Status: DC | PRN
  Administered 2020-02-03: 4 mg via INTRAVENOUS

## 2020-02-03 MED ORDER — SODIUM CHLORIDE 0.9% FLUSH: 3.0000 mL | Freq: Two times a day (BID) | INTRAVENOUS | Status: DC

## 2020-02-03 MED ORDER — ROCURONIUM BROMIDE 10 MG/ML (PF) SYRINGE
PREFILLED_SYRINGE | INTRAVENOUS | Status: AC
  Filled 2020-02-03: qty 30

## 2020-02-03 MED ORDER — ACETAMINOPHEN 650 MG RE SUPP: 650.0000 mg | RECTAL | Status: DC | PRN

## 2020-02-03 MED ORDER — THROMBIN 5000 UNITS EX SOLR
OROMUCOSAL | Status: DC | PRN
  Administered 2020-02-03: 5 mL via TOPICAL

## 2020-02-03 MED ORDER — ACETAMINOPHEN 500 MG PO TABS: 1000.0000 mg | ORAL_TABLET | Freq: Once | ORAL | Status: DC | PRN

## 2020-02-03 MED ORDER — ROCURONIUM BROMIDE 10 MG/ML (PF) SYRINGE
PREFILLED_SYRINGE | INTRAVENOUS | Status: AC
  Filled 2020-02-03: qty 10

## 2020-02-03 MED ORDER — MENTHOL 3 MG MT LOZG: 1.0000 | LOZENGE | OROMUCOSAL | Status: DC | PRN

## 2020-02-03 MED ORDER — ROCURONIUM BROMIDE 10 MG/ML (PF) SYRINGE
PREFILLED_SYRINGE | INTRAVENOUS | Status: DC | PRN
  Administered 2020-02-03: 100 mg via INTRAVENOUS
  Administered 2020-02-03: 20 mg via INTRAVENOUS

## 2020-02-03 MED ORDER — DEXTROSE 5 % IV SOLN
INTRAVENOUS | Status: DC | PRN
  Administered 2020-02-03: 3 g via INTRAVENOUS

## 2020-02-03 MED ORDER — THROMBIN 5000 UNITS EX SOLR
CUTANEOUS | Status: DC | PRN
  Administered 2020-02-03: 10000 [IU] via TOPICAL

## 2020-02-03 SURGICAL SUPPLY — 59 items
BAND RUBBER #18 3X1/16 STRL (MISCELLANEOUS) ×6 IMPLANT
BASKET BONE COLLECTION (BASKET) ×3 IMPLANT
BENZOIN TINCTURE PRP APPL 2/3 (GAUZE/BANDAGES/DRESSINGS) ×3 IMPLANT
BIT DRILL NEURO 2X3.1 SFT TUCH (MISCELLANEOUS) ×1 IMPLANT
BONE VIVIGEN FORMABLE 1.3CC (Bone Implant) ×6 IMPLANT
BUR MATCHSTICK NEURO 3.0 LAGG (BURR) ×3 IMPLANT
CANISTER SUCT 3000ML PPV (MISCELLANEOUS) ×3 IMPLANT
CARTRIDGE OIL MAESTRO DRILL (MISCELLANEOUS) ×1 IMPLANT
CLOSURE WOUND 1/2 X4 (GAUZE/BANDAGES/DRESSINGS) ×1
COVER WAND RF STERILE (DRAPES) IMPLANT
DERMABOND ADVANCED (GAUZE/BANDAGES/DRESSINGS) ×2
DERMABOND ADVANCED .7 DNX12 (GAUZE/BANDAGES/DRESSINGS) ×1 IMPLANT
DIFFUSER DRILL AIR PNEUMATIC (MISCELLANEOUS) ×3 IMPLANT
DRAPE C-ARM 42X72 X-RAY (DRAPES) ×6 IMPLANT
DRAPE LAPAROTOMY 100X72 PEDS (DRAPES) ×3 IMPLANT
DRAPE MICROSCOPE LEICA (MISCELLANEOUS) ×3 IMPLANT
DRILL NEURO 2X3.1 SOFT TOUCH (MISCELLANEOUS) ×3
DRSG OPSITE POSTOP 4X6 (GAUZE/BANDAGES/DRESSINGS) ×3 IMPLANT
DURAPREP 6ML APPLICATOR 50/CS (WOUND CARE) ×3 IMPLANT
ELECT COATED BLADE 2.86 ST (ELECTRODE) ×3 IMPLANT
ELECT REM PT RETURN 9FT ADLT (ELECTROSURGICAL) ×3
ELECTRODE REM PT RTRN 9FT ADLT (ELECTROSURGICAL) ×1 IMPLANT
GAUZE 4X4 16PLY RFD (DISPOSABLE) IMPLANT
GAUZE SPONGE 4X4 12PLY STRL (GAUZE/BANDAGES/DRESSINGS) ×3 IMPLANT
GLOVE BIO SURGEON STRL SZ7 (GLOVE) IMPLANT
GLOVE BIO SURGEON STRL SZ8 (GLOVE) ×3 IMPLANT
GLOVE BIOGEL PI IND STRL 7.0 (GLOVE) IMPLANT
GLOVE BIOGEL PI INDICATOR 7.0 (GLOVE)
GLOVE EXAM NITRILE XL STR (GLOVE) IMPLANT
GLOVE INDICATOR 8.5 STRL (GLOVE) ×3 IMPLANT
GOWN STRL REUS W/ TWL LRG LVL3 (GOWN DISPOSABLE) IMPLANT
GOWN STRL REUS W/ TWL XL LVL3 (GOWN DISPOSABLE) ×1 IMPLANT
GOWN STRL REUS W/TWL 2XL LVL3 (GOWN DISPOSABLE) IMPLANT
GOWN STRL REUS W/TWL LRG LVL3 (GOWN DISPOSABLE)
GOWN STRL REUS W/TWL XL LVL3 (GOWN DISPOSABLE) ×3
HALTER HD/CHIN CERV TRACTION D (MISCELLANEOUS) ×3 IMPLANT
HEMOSTAT POWDER KIT SURGIFOAM (HEMOSTASIS) ×3 IMPLANT
KIT BASIN OR (CUSTOM PROCEDURE TRAY) ×3 IMPLANT
KIT TURNOVER KIT B (KITS) ×3 IMPLANT
NEEDLE SPNL 20GX3.5 QUINCKE YW (NEEDLE) ×3 IMPLANT
NS IRRIG 1000ML POUR BTL (IV SOLUTION) ×6 IMPLANT
OIL CARTRIDGE MAESTRO DRILL (MISCELLANEOUS) ×3
PACK LAMINECTOMY NEURO (CUSTOM PROCEDURE TRAY) ×3 IMPLANT
PAD ARMBOARD 7.5X6 YLW CONV (MISCELLANEOUS) ×9 IMPLANT
PIN DISTRACTION 14MM (PIN) IMPLANT
PLATE ANT CERV XTEND 2 LV 32 (Plate) ×3 IMPLANT
SCREW VAR 4.2 XD SELF DRILL 14 (Screw) ×12 IMPLANT
SCREW VAR 4.2 XD SELF DRILL 16 (Screw) ×6 IMPLANT
SPACER HEDRON C 12X14X6 0D (Spacer) ×6 IMPLANT
SPONGE INTESTINAL PEANUT (DISPOSABLE) ×6 IMPLANT
SPONGE SURGIFOAM ABS GEL SZ50 (HEMOSTASIS) ×3 IMPLANT
STRIP CLOSURE SKIN 1/2X4 (GAUZE/BANDAGES/DRESSINGS) ×2 IMPLANT
SUT VIC AB 3-0 SH 8-18 (SUTURE) ×3 IMPLANT
SUT VICRYL 4-0 PS2 18IN ABS (SUTURE) ×3 IMPLANT
SYR CONTROL 10ML LL (SYRINGE) ×3 IMPLANT
TAPE CLOTH 4X10 WHT NS (GAUZE/BANDAGES/DRESSINGS) IMPLANT
TOWEL GREEN STERILE (TOWEL DISPOSABLE) ×3 IMPLANT
TOWEL GREEN STERILE FF (TOWEL DISPOSABLE) ×3 IMPLANT
WATER STERILE IRR 1000ML POUR (IV SOLUTION) ×3 IMPLANT

## 2020-02-03 NOTE — Op Note (Signed)
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 and previous ACDF from C5-C7  Postoperative diagnosis: Same  Procedure: Anterior cervical discectomy and fusion C3-4 C4-5 utilizing Hedron titanium cages packed with locally harvested autograft mixed with vivigen and globus anterior cervical plating  Surgeon: Jillyn Hidden Devonn Giampietro  Assistant: Kendell Bane Dawley  Anesthesia: General  EBL: Minimal  HPI: 49 year old gentleman with previous history of cervical myelopathy presented with progression worsening of his symptoms work-up revealed severe cervical stenosis C3-4 and C4-5 due to patient's progression of clinical syndrome imaging findings and failed conservative treatment I recommended anterior cervical discectomies and fusion at those levels.  I extensively went over the risks and benefits of that operation with him as well as perioperative course expectations of outcome and alternatives of surgery and he understood and agreed to proceed forward.  Operative procedure: Patient was brought into the OR was due to general anesthesia positioned supine the neck in slight extension 5 pounds halter traction the right side was next prepped and draped in routine sterile fashion.  Preoperative x-ray localized the appropriate level so a curvilinear incision was made just off the midline to the anterior border of the sternocleidomastoid and the superficial layer of the platysma was dissected out divided longitudinally the avascular plane between the sternomastoid and strap muscle was developed down to the previous fashion pretty fast test Kitners.  I immediately identified the old plate and so I dissected up the disc base is above this longus closing reflected laterally and self-retaining retractor was placed.  Both disc base were incised anterior ossified's were bitten off the Leksell rongeur able to space were drilled down with a high-speed  drill and scraped with a BA curette.  Under microscopic nation first working at C3-4 there is disc base was drilled down to the posterior annulus and longitudinal ligament.  The posterior large alumina was partially calcified and after extensive drilling away were able to get up underneath it decompressing central canal aggressively under biting both endplates of both vertebral bodies marching laterally both C4 pedicles identified both C4 nerve roots were skeletonized flush with pedicle.  I then prepared the endplates and sized up 6 mm implant packed with locally harvested autograft mixed and inserted it.  I then removed the distractor pins working at C4-5 now under microscopic illumination this disc base was cleaned out a lot of posterior osteophyte was aggressively aggressively removed and both endplates were aggressively under [place]on decompressing central canal both C5 pedicles were identified both C5 nerve roots were decompressed and skeletonized flush the pedicle after adequate decompression achieved both centrally and foraminally I sized up another 6 mm implant packed with locally harvested autograft mixed and inserted it then a 32 mm globus extend plate was placed I placed for 14 mm screws to 60 mm screws all screws with excellent purchase and postop fluoroscopy confirmed good position of the plate above his previous plate.  I packs an additional bone graft along the sides of the implants and underneath the plate and then after meticulous hemostasis was maintained to close the wound in layers with Vicryl in a running 4 subcuticular Dermabond benzoin Steri-Strips and a sterile dressing was applied patient recovery in stable condition.  At the end the case all needle counts and sponge counts were correct.

## 2020-02-03 NOTE — Transfer of Care (Signed)
Immediate Anesthesia Transfer of Care Note  Patient: Charles Peck  Procedure(s) Performed: Anterior Cervical Discectomy Fusion - Cervical Three- Cervical Four - Cervical Four- Cervical Five (N/A )  Patient Location: PACU  Anesthesia Type:General  Level of Consciousness: drowsy  Airway & Oxygen Therapy: Patient Spontanous Breathing and Patient connected to nasal cannula oxygen  Post-op Assessment: Report given to RN, Post -op Vital signs reviewed and stable and Patient moving all extremities  Post vital signs: Reviewed and stable  Last Vitals:  Vitals Value Taken Time  BP 130/64 02/03/20 1444  Temp    Pulse 88 02/03/20 1449  Resp 22 02/03/20 1449  SpO2 99 % 02/03/20 1449  Vitals shown include unvalidated device data.  Last Pain:  Vitals:   02/03/20 0946  TempSrc: Oral  PainSc:       Patients Stated Pain Goal: 4 (02/03/20 0940)  Complications: No complications documented.

## 2020-02-03 NOTE — Anesthesia Procedure Notes (Signed)
Procedure Name: Intubation Date/Time: 02/03/2020 11:36 AM Performed by: Glynda Jaeger, CRNA Pre-anesthesia Checklist: Patient identified, Patient being monitored, Timeout performed, Emergency Drugs available and Suction available Patient Re-evaluated:Patient Re-evaluated prior to induction Oxygen Delivery Method: Circle System Utilized Preoxygenation: Pre-oxygenation with 100% oxygen Induction Type: IV induction Ventilation: Mask ventilation without difficulty Laryngoscope Size: Mac and 4 Grade View: Grade II Tube type: Oral Tube size: 7.5 mm Number of attempts: 1 Airway Equipment and Method: Stylet Placement Confirmation: ETT inserted through vocal cords under direct vision,  positive ETCO2 and breath sounds checked- equal and bilateral Secured at: 21 cm Tube secured with: Tape Dental Injury: Teeth and Oropharynx as per pre-operative assessment

## 2020-02-03 NOTE — Anesthesia Preprocedure Evaluation (Signed)
Anesthesia Evaluation  Patient identified by MRN, date of birth, ID band Patient awake    Reviewed: Allergy & Precautions, NPO status , Patient's Chart, lab work & pertinent test results  History of Anesthesia Complications Negative for: history of anesthetic complications  Airway Mallampati: I  TM Distance: >3 FB Neck ROM: Full    Dental  (+) Poor Dentition, Chipped, Missing   Pulmonary neg shortness of breath, sleep apnea , neg COPD, former smoker,    breath sounds clear to auscultation       Cardiovascular negative cardio ROS   Rhythm:Regular     Neuro/Psych  Headaches, negative psych ROS   GI/Hepatic negative GI ROS, Neg liver ROS,   Endo/Other  Morbid obesity  Renal/GU negative Renal ROS     Musculoskeletal  (+) Arthritis ,   Abdominal   Peds  Hematology negative hematology ROS (+)   Anesthesia Other Findings   Reproductive/Obstetrics                             Anesthesia Physical Anesthesia Plan  ASA: II  Anesthesia Plan: General   Post-op Pain Management:    Induction: Intravenous  PONV Risk Score and Plan: 2 and Ondansetron and Dexamethasone  Airway Management Planned: Oral ETT  Additional Equipment: None  Intra-op Plan:   Post-operative Plan: Extubation in OR  Informed Consent: I have reviewed the patients History and Physical, chart, labs and discussed the procedure including the risks, benefits and alternatives for the proposed anesthesia with the patient or authorized representative who has indicated his/her understanding and acceptance.     Dental advisory given  Plan Discussed with: CRNA and Surgeon  Anesthesia Plan Comments:         Anesthesia Quick Evaluation

## 2020-02-03 NOTE — Anesthesia Postprocedure Evaluation (Signed)
Anesthesia Post Note  Patient: Charles Peck  Procedure(s) Performed: Anterior Cervical Discectomy Fusion - Cervical Three- Cervical Four - Cervical Four- Cervical Five (N/A )     Patient location during evaluation: PACU Anesthesia Type: General Level of consciousness: awake and alert Pain management: pain level controlled Vital Signs Assessment: post-procedure vital signs reviewed and stable Respiratory status: spontaneous breathing, nonlabored ventilation, respiratory function stable and patient connected to nasal cannula oxygen Cardiovascular status: blood pressure returned to baseline and stable Postop Assessment: no apparent nausea or vomiting Anesthetic complications: no   No complications documented.  Last Vitals:  Vitals:   02/03/20 1500 02/03/20 1515  BP: (!) 116/56 (!) 120/57  Pulse: 94 78  Resp: (!) 23 14  Temp:    SpO2: 100% 98%                 Shelton Silvas

## 2020-02-03 NOTE — H&P (Signed)
Charles Peck is an 49 y.o. male.   Chief Complaint: Neck pain bilateral shoulder pain numbness tingling in his hands weakness difficulty walking HPI: 49 year old gentleman with baseline cervical myelopathy from previous spinal cord injury and surgery at C5-6 presents with worsening of his baseline symptoms increased numbness in his hands weakness in his hands and difficulty walking work-up revealed severe cord compression stenosis at C3-4 and C4-5 above the level of his previous fusion.  Due to patient's progression of clinical syndrome imaging findings and failed conservative treatment I recommended anterior cervical discectomy and fusion at those 2 levels.  I extensively reviewed the risks and benefits of the operation with him as well as perioperative course expectations of outcome and alternatives to surgery and he understood and agreed to proceed forward.  Past Medical History:  Diagnosis Date  . Degenerative joint disease   . Headache    not in a while    . RA (rheumatoid arthritis) ([place])   . Sleep apnea 2018  . Testicle swelling 03-29-13   exploratory surgery planned of right testicle  . UTI (urinary tract infection)     Past Surgical History:  Procedure Laterality Date  . BACK SURGERY    . BUNIONECTOMY  2012   also foot surgery to both feet  . CERVICAL FUSION  2005  . CYSTOSCOPY N/A 04/01/2013   Procedure: CYSTOSCOPY FLEXIBLE EXCISIONAL BIOPSY ;  Surgeon: Fredricka Bonine, MD;  Location: [facility];  Service: Urology;  Laterality: N/A;  EXCISIONAL BIOPSY   . HIP ARTHROPLASTY  2011   bilateral 1 month  . [place] SURGERY  2009  . TESTICULAR EXPLORATION Right 04/01/2013   Procedure: RIGHT INGUINAL EXPLORATION;  Surgeon: Fredricka Bonine, MD;  Location: [facility];  Service: Urology;  Laterality: Right;  RIGHT INGUINAL EXPLORATION  . TOTAL KNEE ARTHROPLASTY  01/12/2012   Procedure: TOTAL KNEE ARTHROPLASTY;  Surgeon: Kerin Salen, MD;  Location: [facility];  Service:  Orthopedics;  Laterality: Right;  . TOTAL KNEE REVISION Right 05/02/2015  . TOTAL KNEE REVISION Right 05/02/2015   Procedure: ONE COMPONENT KNEE REVISION;  Surgeon: Frederik Pear, MD;  Location: [facility];  Service: Orthopedics;  Laterality: Right;    Family History  Problem Relation Age of Onset  . Breast cancer Mother   . Epilepsy Brother   . Breast cancer Other   . Epilepsy Other    Social History:  reports that he quit smoking about 23 years ago. He has never used smokeless tobacco. He reports that he does not drink alcohol and does not use drugs.  Allergies: No Known Allergies  Medications Prior to Admission  Medication Sig Dispense Refill  . CIMZIA PREFILLED 2 X 200 MG/ML KIT Inject 400 mg into the skin every 28 (twenty-eight) days.     Marland Kitchen gabapentin (NEURONTIN) 300 MG capsule Take 300-600 mg by mouth at bedtime.    . hydroxychloroquine (PLAQUENIL) 200 MG tablet Take 200 mg by mouth in the morning and at bedtime.    Marland Kitchen oxycodone (ROXICODONE) 30 MG immediate release tablet Take 30 mg by mouth in the morning, at noon, and at bedtime.   0    Results for orders placed or performed during the hospital encounter of 02/01/20 (from the past 48 hour(s))  SARS CORONAVIRUS 2 (TAT 6-24 HRS) Nasopharyngeal Nasopharyngeal Swab     Status: None   Collection Time: 02/01/20 12:30 PM   Specimen: Nasopharyngeal Swab  Result Value Ref Range   SARS Coronavirus 2 NEGATIVE NEGATIVE  Comment: (NOTE) SARS-CoV-2 target nucleic acids are NOT DETECTED.  The SARS-CoV-2 RNA is generally detectable in upper and lower respiratory specimens during the acute phase of infection. Negative results do not preclude SARS-CoV-2 infection, do not rule out co-infections with other pathogens, and should not be used as the sole basis for treatment or other patient management decisions. Negative results must be combined with clinical observations, patient history, and epidemiological information. The expected result is  Negative.  Fact Sheet for Patients: SugarRoll.be  Fact Sheet for Healthcare Providers: https://www.woods-mathews.com/  This test is not yet approved or cleared by the [place] FDA and  has been authorized for detection and/or diagnosis of SARS-CoV-2 by FDA under an Emergency Use Authorization (EUA). This EUA will remain  in effect (meaning this test can be used) for the duration of the COVID-19 declaration under Se ction 564(b)(1) of the Act, 21 U.S.C. section 360bbb-3(b)(1), unless the authorization is terminated or revoked sooner.  Performed at [facility], [place] [address] [place], [place] [address]    No results found.  Review of Systems  Musculoskeletal: Positive for neck pain.  Neurological: Positive for weakness and numbness.    Blood pressure (!) 141/85, pulse 72, temperature 99 F (37.2 C), temperature source Oral, resp. rate 17, height '6\' 4"'  (1.93 m), weight (!) 158.8 kg, SpO2 99 %. Physical Exam HENT:     Head: Normocephalic.     Nose: Nose normal.     Mouth/Throat:     Mouth: Mucous membranes are moist.  Eyes:     Pupils: Pupils are equal, round, and reactive to light.  Pulmonary:     Effort: Pulmonary effort is normal.  Abdominal:     General: Abdomen is flat.  Musculoskeletal:        General: Normal range of motion.  Skin:    General: Skin is warm.  Neurological:     General: No focal deficit present.     Mental Status: He is alert.     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

## 2020-02-04 DIAGNOSIS — M4712 Other spondylosis with myelopathy, cervical region: Secondary | ICD-10-CM | POA: Diagnosis not present

## 2020-02-04 DIAGNOSIS — Z79899 Other long term (current) drug therapy: Secondary | ICD-10-CM | POA: Diagnosis not present

## 2020-02-04 DIAGNOSIS — M4802 Spinal stenosis, cervical region: Secondary | ICD-10-CM | POA: Diagnosis not present

## 2020-02-04 DIAGNOSIS — Z96651 Presence of right artificial knee joint: Secondary | ICD-10-CM | POA: Diagnosis not present

## 2020-02-04 DIAGNOSIS — Z96643 Presence of artificial hip joint, bilateral: Secondary | ICD-10-CM | POA: Diagnosis not present

## 2020-02-04 DIAGNOSIS — Z87891 Personal history of nicotine dependence: Secondary | ICD-10-CM | POA: Diagnosis not present

## 2020-02-04 MED ORDER — METHOCARBAMOL 500 MG PO TABS: 500.0000 mg | ORAL_TABLET | Freq: Three times a day (TID) | ORAL | 1 refills | Status: AC | PRN

## 2020-02-04 MED ORDER — OXYCODONE HCL 30 MG PO TABS: 30.0000 mg | ORAL_TABLET | Freq: Three times a day (TID) | ORAL | 0 refills | Status: AC

## 2020-02-04 NOTE — Evaluation (Signed)
Physical Therapy Evaluation Patient Details Name: Charles Peck MRN: [id number] DOB: 09-Dec-1970 Today's Date: 02/04/2020   History of Present Illness  Pt is a 49 y/o male who presents s/p C3-C5 ACDF on 02/03/2020. PMH significant for RA, prior cervical fusion, R TKA and revision, and Bilateral THR.   Clinical Impression  Pt admitted with above diagnosis. At the time of PT eval, pt was able to demonstrate transfers and ambulation with gross min guard assist and RW for support. Pt with very flexed trunk at baseline (10-15 years per pt report). Overall he reports mobilizing better now than prior to surgery. Pt was educated on precautions, brace application/wearing schedule, appropriate activity progression, and car transfer. Pt currently with functional limitations due to the deficits listed below (see PT Problem List). Pt will benefit from skilled PT to increase their independence and safety with mobility to allow discharge to the venue listed below.      Follow Up Recommendations No PT follow up;Supervision for mobility/OOB    Equipment Recommendations  None recommended by PT    Recommendations for Other Services       Precautions / Restrictions Precautions Precautions: Fall;Cervical Precaution Booklet Issued: Yes (comment) Precaution Comments: Reviewed handout and pt was cued for precautions during functional mobility.  Required Braces or Orthoses: Cervical Brace Cervical Brace: Soft collar;At all times Restrictions Weight Bearing Restrictions: No      Mobility  Bed Mobility               General bed mobility comments: Pt was received sitting up EOB finishing breakfast    Transfers Overall transfer level: Needs assistance Equipment used: Rolling walker (2 wheeled) Transfers: Sit to/from Stand Sit to Stand: Min guard         General transfer comment: Close guard for safety as pt powered up to full stand.   Ambulation/Gait Ambulation/Gait assistance: Min  guard Gait Distance (Feet): 200 Feet Assistive device: Rolling walker (2 wheeled) Gait Pattern/deviations: Step-through pattern;Decreased stride length;Decreased weight shift to right;Trunk flexed Gait velocity: Decreased Gait velocity interpretation: <1.31 ft/sec, indicative of household ambulator General Gait Details: VC's for improved posture, closer walker proximity, and forward gaze. No assist required but close guard provided for safety.   Stairs            Wheelchair Mobility    Modified Rankin (Stroke Patients Only)       Balance Overall balance assessment: Needs assistance Sitting-balance support: No upper extremity supported;Feet supported Sitting balance-Leahy Scale: Good     Standing balance support: Bilateral upper extremity supported;During functional activity;Single extremity supported Standing balance-Leahy Scale: Poor Standing balance comment: reliant on UE support.                              Pertinent Vitals/Pain Pain Assessment: Faces Faces Pain Scale: Hurts a little bit Pain Location: incision site Pain Descriptors / Indicators: Discomfort;Operative site guarding Pain Intervention(s): Limited activity within patient's tolerance;Monitored during session;Repositioned    Home Living Family/patient expects to be discharged to:: Private residence Living Arrangements: Spouse/significant other;Children Available Help at Discharge: Family;Available 24 hours/day Type of Home: House Home Access: Stairs to enter   [employer] of Steps: 1 Home Layout: One level Home Equipment: Walker - 2 wheels;Bedside commode;Cane - single point      Prior Function Level of Independence: Independent with assistive device(s)         Comments: SPC     Hand Dominance  Dominant Hand: Right    Extremity/Trunk Assessment   Upper Extremity Assessment Upper Extremity Assessment: Defer to OT evaluation (N/T still present but improived per  pt report.)    Lower Extremity Assessment Lower Extremity Assessment: RLE deficits/detail RLE Deficits / Details: Generalized weakness bilaterally. R knee replacement and revision, bilateral hip replacements    Cervical / Trunk Assessment Cervical / Trunk Assessment: Other exceptions Cervical / Trunk Exceptions: s/p cervical sx; baseline trunk flexion (pt reports 10-15 years)  Communication   Communication: No difficulties  Cognition Arousal/Alertness: Awake/alert Behavior During Therapy: WFL for tasks assessed/performed Overall Cognitive Status: Within Functional Limits for tasks assessed                                        General Comments      Exercises     Assessment/Plan    PT Assessment Patient needs continued PT services  PT Problem List Decreased strength;Decreased activity tolerance;Decreased balance;Decreased mobility;Decreased knowledge of use of DME;Decreased safety awareness;Decreased knowledge of precautions;Pain       PT Treatment Interventions DME instruction;Gait training;Functional mobility training;Therapeutic activities;Therapeutic exercise;Neuromuscular re-education;Patient/family education    PT Goals (Current goals can be found in the Care Plan section)  Acute Rehab PT Goals Patient Stated Goal: Home today PT Goal Formulation: With patient Time For Goal Achievement: 02/11/20 Potential to Achieve Goals: Good    Frequency Min 5X/week   Barriers to discharge        Co-evaluation               AM-PAC PT "6 Clicks" Mobility  Outcome Measure Help needed turning from your back to your side while in a flat bed without using bedrails?: None Help needed moving from lying on your back to sitting on the side of a flat bed without using bedrails?: A Little Help needed moving to and from a bed to a chair (including a wheelchair)?: A Little Help needed standing up from a chair using your arms (e.g., wheelchair or bedside chair)?:  A Little Help needed to walk in hospital room?: A Little Help needed climbing 3-5 steps with a railing? : A Little 6 Click Score: 19    End of Session Equipment Utilized During Treatment: Gait belt;Cervical collar Activity Tolerance: No increased pain Patient left: Other (comment) Nurse Communication: Mobility status PT Visit Diagnosis: Unsteadiness on feet (R26.81);Pain Pain - part of body:  (incision site)    Time: 6834-1962 PT Time Calculation (min) (ACUTE ONLY): 32 min   Charges:   PT Evaluation $PT Eval Moderate Complexity: 1 Mod PT Treatments $Gait Training: 8-22 mins        Conni Slipper, PT, DPT Acute Rehabilitation Services Pager: [phone] Office: [phone]   Marylynn Pearson 02/04/2020, 10:16 AM

## 2020-02-04 NOTE — Discharge Instructions (Signed)

## 2020-02-04 NOTE — Progress Notes (Signed)
Patient is discharged from room 3C07 at this time. Alert and in stable condition. IV site d/c'd and instructions read to patient and spouse with understanding verbalized and all questions answered. Left unit via wheelchair with all belongings at side. 

## 2020-02-04 NOTE — Discharge Summary (Signed)
  Physician Discharge Summary  Patient ID: Charles Peck MRN: [id number] DOB/AGE: 06-05-1970 49 y.o.  Admit date: 02/03/2020 Discharge date: 02/04/2020  Admission Diagnoses:  Cervical myelopathy, cervical stenosis  Discharge Diagnoses:  Same Active Problems:   Cervical stenosis of spinal canal   Discharged Condition: Stable  Hospital Course:  Charles Peck is a 49 y.o. male who underwent the below surgery and tolerated it well.  He was evaluated by physical therapy and Occupational Therapy postoperative day 1 and was deemed that he could go home with family care.  His pain was controlled postoperatively on oral medication, he was having normal bowel and bladder function and was at his neurologic baseline.  Treatments: Surgery -ACDF C3-5 on 02/03/2020 by Dr. Saintclair Halsted  Discharge Exam: Blood pressure (!) 162/93, pulse 82, temperature (!) 100.4 F (38 C), temperature source Oral, resp. rate 18, height _0  (1.93 m), weight (!) 158.8 kg, SpO2 100 %. Awake, alert, oriented Speech fluent, appropriate CN grossly intact Move all extremities equally Wound c/d/i  Disposition: Discharge disposition: 01-Home or Self Care       Discharge Instructions    Incentive spirometry RT   Complete by: As directed      Allergies as of 02/04/2020   No Known Allergies     Medication List    TAKE these medications   Cimzia Prefilled 2 X 200 MG/ML Kit Generic drug: Certolizumab Pegol Inject 400 mg into the skin every 28 (twenty-eight) days.   gabapentin 300 MG capsule Commonly known as: NEURONTIN Take 300-600 mg by mouth at bedtime.   hydroxychloroquine 200 MG tablet Commonly known as: PLAQUENIL Take 200 mg by mouth in the morning and at bedtime.   methocarbamol 500 MG tablet Commonly known as: ROBAXIN Take 1 tablet (500 mg total) by mouth every 8 (eight) hours as needed for muscle spasms.   oxycodone 30 MG immediate release tablet Commonly known as: ROXICODONE Take 1 tablet  (30 mg total) by mouth 3 (three) times daily. What changed: when to take this       Follow-up Information    Kary Kos, MD Follow up in 3 week(s).   Specialty: Neurosurgery Why: call for appointment Contact information: [address] [place] [address] [phone]               Signed: Theodoro Doing Adriana Lina 02/04/2020, 9:43 AM

## 2020-02-04 NOTE — Evaluation (Signed)
Occupational Therapy Evaluation Patient Details Name: Charles Peck MRN: [id number] DOB: [birth year] Today's Date: 02/04/2020    History of Present Illness 49 yo male presenting with s/p C3-5 ACDF. PMH including RA, cervical fusion, R TKA, and bil THA.   Clinical Impression   PTA, pt was living with his wife and was independent with BADLs using AE and using RW for mobility. Currently, pt performing ADLs with AE and functional mobility using RW at Mod I level; requiring assistance for brace management. Provided education and handout on cervical precautions, grooming, UB ADLs, LB ADLs with AE, toileting, and shower transfer; pt demonstrated understanding. Answered all pt questions. Recommend dc home once medically stable per physician. All acute OT needs met and will sign off. Thank you.    Follow Up Recommendations  No OT follow up    Equipment Recommendations  None recommended by OT    Recommendations for Other Services PT consult     Precautions / Restrictions Precautions Precautions: Fall;Cervical Precaution Booklet Issued: Yes (comment) Precaution Comments: Reviewed cervical precautions and compensatory techniques  Required Braces or Orthoses: Cervical Brace Cervical Brace: Soft collar;At all times      Mobility Bed Mobility               General bed mobility comments: In hallway with PT upon arrival.    Transfers Overall transfer level: Needs assistance Equipment used: Rolling walker (2 wheeled) Transfers: Sit to/from Stand Sit to Stand: Min guard         General transfer comment: MIn Guard A for safety    Balance Overall balance assessment: Needs assistance Sitting-balance support: No upper extremity supported;Feet supported Sitting balance-Leahy Scale: Good     Standing balance support: Bilateral upper extremity supported;During functional activity;Single extremity supported Standing balance-Leahy Scale: Poor                              ADL either performed or assessed with clinical judgement   ADL Overall ADL's : Modified independent                                       General ADL Comments: Pt performing ADLs at Mod I level with increased time. Providing education on cervical precautions, brace management, UB ADLs, LB ADLs, toileting, and shower transfer. Pt donning clothes demonsatrating understanding. Requiring assistance for brace management.      Vision         Perception     Praxis      Pertinent Vitals/Pain Pain Assessment: Faces Faces Pain Scale: Hurts a little bit Pain Location: Neck Pain Descriptors / Indicators: Constant;Discomfort Pain Intervention(s): Monitored during session;Limited activity within patient's tolerance;Repositioned     Hand Dominance Right   Extremity/Trunk Assessment Upper Extremity Assessment Upper Extremity Assessment: Generalized weakness   Lower Extremity Assessment Lower Extremity Assessment: Defer to PT evaluation   Cervical / Trunk Assessment Cervical / Trunk Assessment: Other exceptions Cervical / Trunk Exceptions: s/p cervical sx/ baseline forward flexion   Communication Communication Communication: No difficulties   Cognition Arousal/Alertness: Awake/alert Behavior During Therapy: WFL for tasks assessed/performed Overall Cognitive Status: Within Functional Limits for tasks assessed                                     General  Comments       Exercises     Shoulder Instructions      Home Living Family/patient expects to be discharged to:: Private residence Living Arrangements: Spouse/significant other;Children Available Help at Discharge: Family;Available 24 hours/day         Home Layout: One level     Bathroom Shower/Tub: Occupational psychologist: Standard (BSC over toilet)     Home Equipment: Walker - 2 wheels;Bedside commode          Prior Functioning/Environment Level of Independence:  Independent with assistive device(s)        Comments: Pt uses AE and DME for ADLs and functional mobility. performing BADLs and famiyl does IADLs        OT Problem List: Decreased strength;Decreased range of motion;Decreased activity tolerance;Impaired balance (sitting and/or standing);Decreased knowledge of use of DME or AE;Decreased knowledge of precautions;Pain      OT Treatment/Interventions:      OT Goals(Current goals can be found in the care plan section) Acute Rehab OT Goals Patient Stated Goal: Go home OT Goal Formulation: All assessment and education complete, DC therapy  OT Frequency:     Barriers to D/C:            Co-evaluation              AM-PAC OT "6 Clicks" Daily Activity     Outcome Measure Help from another person eating meals?: None Help from another person taking care of personal grooming?: None Help from another person toileting, which includes using toliet, bedpan, or urinal?: None Help from another person bathing (including washing, rinsing, drying)?: None Help from another person to put on and taking off regular upper body clothing?: None Help from another person to put on and taking off regular lower body clothing?: None 6 Click Score: 24   End of Session Equipment Utilized During Treatment: Rolling walker;Cervical collar Nurse Communication: Mobility status  Activity Tolerance: Patient tolerated treatment well Patient left: in chair;with call bell/phone within reach  OT Visit Diagnosis: Unsteadiness on feet (R26.81);Other abnormalities of gait and mobility (R26.89);Pain Pain - part of body:  (Neck)                Time: 0626-9485 OT Time Calculation (min): 22 min Charges:  OT General Charges $OT Visit: 1 Visit OT Evaluation $OT Eval Moderate Complexity: [place], OTR/L Acute Rehab Pager: [phone] Office: [place] 02/04/2020, 8:30 AM

## 2020-02-06 ENCOUNTER — Encounter (HOSPITAL_COMMUNITY): Payer: Self-pay | Admitting: Neurosurgery

## 2020-02-16 DIAGNOSIS — G959 Disease of spinal cord, unspecified: Secondary | ICD-10-CM | POA: Diagnosis not present

## 2020-02-16 DIAGNOSIS — R030 Elevated blood-pressure reading, without diagnosis of hypertension: Secondary | ICD-10-CM | POA: Diagnosis not present

## 2020-02-16 DIAGNOSIS — Z6841 Body Mass Index (BMI) 40.0 and over, adult: Secondary | ICD-10-CM | POA: Diagnosis not present

## 2020-02-29 DIAGNOSIS — I739 Peripheral vascular disease, unspecified: Secondary | ICD-10-CM | POA: Diagnosis not present

## 2020-02-29 DIAGNOSIS — L84 Corns and callosities: Secondary | ICD-10-CM | POA: Diagnosis not present

## 2020-03-07 DIAGNOSIS — G959 Disease of spinal cord, unspecified: Secondary | ICD-10-CM | POA: Diagnosis not present

## 2020-03-07 DIAGNOSIS — M5412 Radiculopathy, cervical region: Secondary | ICD-10-CM | POA: Diagnosis not present

## 2020-03-07 DIAGNOSIS — M4802 Spinal stenosis, cervical region: Secondary | ICD-10-CM | POA: Diagnosis not present

## 2020-03-07 DIAGNOSIS — Z981 Arthrodesis status: Secondary | ICD-10-CM | POA: Diagnosis not present

## 2020-03-15 DIAGNOSIS — M5412 Radiculopathy, cervical region: Secondary | ICD-10-CM | POA: Diagnosis not present

## 2020-03-15 DIAGNOSIS — R030 Elevated blood-pressure reading, without diagnosis of hypertension: Secondary | ICD-10-CM | POA: Diagnosis not present

## 2020-03-15 DIAGNOSIS — G959 Disease of spinal cord, unspecified: Secondary | ICD-10-CM | POA: Diagnosis not present

## 2020-03-15 DIAGNOSIS — Z6841 Body Mass Index (BMI) 40.0 and over, adult: Secondary | ICD-10-CM | POA: Diagnosis not present

## 2020-03-21 DIAGNOSIS — L84 Corns and callosities: Secondary | ICD-10-CM | POA: Diagnosis not present

## 2020-03-21 DIAGNOSIS — I739 Peripheral vascular disease, unspecified: Secondary | ICD-10-CM | POA: Diagnosis not present

## 2020-04-11 DIAGNOSIS — F1120 Opioid dependence, uncomplicated: Secondary | ICD-10-CM | POA: Diagnosis not present

## 2020-04-11 DIAGNOSIS — M5412 Radiculopathy, cervical region: Secondary | ICD-10-CM | POA: Diagnosis not present

## 2020-04-11 DIAGNOSIS — Z981 Arthrodesis status: Secondary | ICD-10-CM | POA: Diagnosis not present

## 2020-04-26 DIAGNOSIS — M5412 Radiculopathy, cervical region: Secondary | ICD-10-CM | POA: Diagnosis not present

## 2020-05-21 DIAGNOSIS — L84 Corns and callosities: Secondary | ICD-10-CM | POA: Diagnosis not present

## 2020-05-21 DIAGNOSIS — I739 Peripheral vascular disease, unspecified: Secondary | ICD-10-CM | POA: Diagnosis not present

## 2020-05-22 DIAGNOSIS — R030 Elevated blood-pressure reading, without diagnosis of hypertension: Secondary | ICD-10-CM | POA: Diagnosis not present

## 2020-05-22 DIAGNOSIS — M5412 Radiculopathy, cervical region: Secondary | ICD-10-CM | POA: Diagnosis not present

## 2020-05-22 DIAGNOSIS — M4802 Spinal stenosis, cervical region: Secondary | ICD-10-CM | POA: Diagnosis not present

## 2020-05-22 DIAGNOSIS — Z6839 Body mass index (BMI) 39.0-39.9, adult: Secondary | ICD-10-CM | POA: Diagnosis not present

## 2020-05-22 DIAGNOSIS — F1120 Opioid dependence, uncomplicated: Secondary | ICD-10-CM | POA: Diagnosis not present

## 2020-05-22 DIAGNOSIS — Z981 Arthrodesis status: Secondary | ICD-10-CM | POA: Diagnosis not present

## 2020-05-29 ENCOUNTER — Ambulatory Visit (HOSPITAL_BASED_OUTPATIENT_CLINIC_OR_DEPARTMENT_OTHER): Payer: Medicare HMO | Admitting: Physical Therapy

## 2020-05-29 DIAGNOSIS — Z1211 Encounter for screening for malignant neoplasm of colon: Secondary | ICD-10-CM | POA: Diagnosis not present

## 2020-05-29 DIAGNOSIS — Z125 Encounter for screening for malignant neoplasm of prostate: Secondary | ICD-10-CM | POA: Diagnosis not present

## 2020-05-29 DIAGNOSIS — E669 Obesity, unspecified: Secondary | ICD-10-CM | POA: Diagnosis not present

## 2020-05-29 DIAGNOSIS — Z0000 Encounter for general adult medical examination without abnormal findings: Secondary | ICD-10-CM | POA: Diagnosis not present

## 2020-05-29 DIAGNOSIS — G894 Chronic pain syndrome: Secondary | ICD-10-CM | POA: Diagnosis not present

## 2020-05-29 DIAGNOSIS — E7800 Pure hypercholesterolemia, unspecified: Secondary | ICD-10-CM | POA: Diagnosis not present

## 2020-06-19 ENCOUNTER — Ambulatory Visit (HOSPITAL_BASED_OUTPATIENT_CLINIC_OR_DEPARTMENT_OTHER): Payer: Medicare HMO | Attending: Student | Admitting: Physical Therapy

## 2020-07-17 DIAGNOSIS — M4802 Spinal stenosis, cervical region: Secondary | ICD-10-CM | POA: Diagnosis not present

## 2020-07-17 DIAGNOSIS — Z981 Arthrodesis status: Secondary | ICD-10-CM | POA: Diagnosis not present

## 2020-07-19 DIAGNOSIS — M5136 Other intervertebral disc degeneration, lumbar region: Secondary | ICD-10-CM | POA: Diagnosis not present

## 2020-07-19 DIAGNOSIS — G8929 Other chronic pain: Secondary | ICD-10-CM | POA: Diagnosis not present

## 2020-07-19 DIAGNOSIS — M150 Primary generalized (osteo)arthritis: Secondary | ICD-10-CM | POA: Diagnosis not present

## 2020-07-19 DIAGNOSIS — Z79899 Other long term (current) drug therapy: Secondary | ICD-10-CM | POA: Diagnosis not present

## 2020-07-19 DIAGNOSIS — D869 Sarcoidosis, unspecified: Secondary | ICD-10-CM | POA: Diagnosis not present

## 2020-07-19 DIAGNOSIS — G894 Chronic pain syndrome: Secondary | ICD-10-CM | POA: Diagnosis not present

## 2020-07-19 DIAGNOSIS — M533 Sacrococcygeal disorders, not elsewhere classified: Secondary | ICD-10-CM | POA: Diagnosis not present

## 2020-07-19 DIAGNOSIS — M0609 Rheumatoid arthritis without rheumatoid factor, multiple sites: Secondary | ICD-10-CM | POA: Diagnosis not present

## 2020-07-20 DIAGNOSIS — L84 Corns and callosities: Secondary | ICD-10-CM | POA: Diagnosis not present

## 2020-07-20 DIAGNOSIS — I739 Peripheral vascular disease, unspecified: Secondary | ICD-10-CM | POA: Diagnosis not present

## 2020-08-15 DIAGNOSIS — I739 Peripheral vascular disease, unspecified: Secondary | ICD-10-CM | POA: Diagnosis not present

## 2020-08-15 DIAGNOSIS — L84 Corns and callosities: Secondary | ICD-10-CM | POA: Diagnosis not present

## 2020-08-28 DIAGNOSIS — M4802 Spinal stenosis, cervical region: Secondary | ICD-10-CM | POA: Diagnosis not present

## 2020-08-28 DIAGNOSIS — Z981 Arthrodesis status: Secondary | ICD-10-CM | POA: Diagnosis not present

## 2020-08-28 DIAGNOSIS — F1120 Opioid dependence, uncomplicated: Secondary | ICD-10-CM | POA: Diagnosis not present

## 2020-09-10 DIAGNOSIS — E7800 Pure hypercholesterolemia, unspecified: Secondary | ICD-10-CM | POA: Diagnosis not present

## 2020-09-27 DIAGNOSIS — M4802 Spinal stenosis, cervical region: Secondary | ICD-10-CM | POA: Diagnosis not present

## 2020-09-27 DIAGNOSIS — F1120 Opioid dependence, uncomplicated: Secondary | ICD-10-CM | POA: Diagnosis not present

## 2020-09-27 DIAGNOSIS — Z981 Arthrodesis status: Secondary | ICD-10-CM | POA: Diagnosis not present

## 2020-09-28 DIAGNOSIS — I739 Peripheral vascular disease, unspecified: Secondary | ICD-10-CM | POA: Diagnosis not present

## 2020-09-28 DIAGNOSIS — L603 Nail dystrophy: Secondary | ICD-10-CM | POA: Diagnosis not present

## 2020-09-28 DIAGNOSIS — L84 Corns and callosities: Secondary | ICD-10-CM | POA: Diagnosis not present

## 2020-10-26 DIAGNOSIS — M4802 Spinal stenosis, cervical region: Secondary | ICD-10-CM | POA: Diagnosis not present

## 2020-10-26 DIAGNOSIS — Z981 Arthrodesis status: Secondary | ICD-10-CM | POA: Diagnosis not present

## 2020-11-09 DIAGNOSIS — L603 Nail dystrophy: Secondary | ICD-10-CM | POA: Diagnosis not present

## 2020-11-09 DIAGNOSIS — I739 Peripheral vascular disease, unspecified: Secondary | ICD-10-CM | POA: Diagnosis not present

## 2020-11-09 DIAGNOSIS — L84 Corns and callosities: Secondary | ICD-10-CM | POA: Diagnosis not present

## 2020-11-15 DIAGNOSIS — G894 Chronic pain syndrome: Secondary | ICD-10-CM | POA: Diagnosis not present

## 2020-11-15 DIAGNOSIS — M5136 Other intervertebral disc degeneration, lumbar region: Secondary | ICD-10-CM | POA: Diagnosis not present

## 2020-11-15 DIAGNOSIS — D869 Sarcoidosis, unspecified: Secondary | ICD-10-CM | POA: Diagnosis not present

## 2020-11-15 DIAGNOSIS — E669 Obesity, unspecified: Secondary | ICD-10-CM | POA: Diagnosis not present

## 2020-11-15 DIAGNOSIS — Z6833 Body mass index (BMI) 33.0-33.9, adult: Secondary | ICD-10-CM | POA: Diagnosis not present

## 2020-11-15 DIAGNOSIS — M150 Primary generalized (osteo)arthritis: Secondary | ICD-10-CM | POA: Diagnosis not present

## 2020-11-15 DIAGNOSIS — M0609 Rheumatoid arthritis without rheumatoid factor, multiple sites: Secondary | ICD-10-CM | POA: Diagnosis not present

## 2020-11-15 DIAGNOSIS — Z79899 Other long term (current) drug therapy: Secondary | ICD-10-CM | POA: Diagnosis not present

## 2020-11-23 DIAGNOSIS — M2042 Other hammer toe(s) (acquired), left foot: Secondary | ICD-10-CM | POA: Diagnosis not present

## 2020-11-23 DIAGNOSIS — M71572 Other bursitis, not elsewhere classified, left ankle and foot: Secondary | ICD-10-CM | POA: Diagnosis not present

## 2020-11-29 DIAGNOSIS — F1120 Opioid dependence, uncomplicated: Secondary | ICD-10-CM | POA: Diagnosis not present

## 2020-11-29 DIAGNOSIS — M5412 Radiculopathy, cervical region: Secondary | ICD-10-CM | POA: Diagnosis not present

## 2020-11-29 DIAGNOSIS — M4802 Spinal stenosis, cervical region: Secondary | ICD-10-CM | POA: Diagnosis not present

## 2020-11-29 DIAGNOSIS — Z981 Arthrodesis status: Secondary | ICD-10-CM | POA: Diagnosis not present

## 2020-12-06 DIAGNOSIS — M24575 Contracture, left foot: Secondary | ICD-10-CM | POA: Diagnosis not present

## 2021-01-03 DIAGNOSIS — I739 Peripheral vascular disease, unspecified: Secondary | ICD-10-CM | POA: Diagnosis not present

## 2021-01-03 DIAGNOSIS — L84 Corns and callosities: Secondary | ICD-10-CM | POA: Diagnosis not present

## 2021-01-22 DIAGNOSIS — M4802 Spinal stenosis, cervical region: Secondary | ICD-10-CM | POA: Diagnosis not present

## 2021-01-22 DIAGNOSIS — Z981 Arthrodesis status: Secondary | ICD-10-CM | POA: Diagnosis not present

## 2021-01-22 DIAGNOSIS — M5136 Other intervertebral disc degeneration, lumbar region: Secondary | ICD-10-CM | POA: Diagnosis not present

## 2021-01-22 DIAGNOSIS — F1120 Opioid dependence, uncomplicated: Secondary | ICD-10-CM | POA: Diagnosis not present

## 2021-01-23 DIAGNOSIS — I739 Peripheral vascular disease, unspecified: Secondary | ICD-10-CM | POA: Diagnosis not present

## 2021-01-23 DIAGNOSIS — L603 Nail dystrophy: Secondary | ICD-10-CM | POA: Diagnosis not present

## 2021-01-23 DIAGNOSIS — L84 Corns and callosities: Secondary | ICD-10-CM | POA: Diagnosis not present

## 2021-01-24 DIAGNOSIS — E785 Hyperlipidemia, unspecified: Secondary | ICD-10-CM | POA: Diagnosis not present

## 2021-01-24 DIAGNOSIS — R609 Edema, unspecified: Secondary | ICD-10-CM | POA: Diagnosis not present

## 2021-02-15 DIAGNOSIS — I739 Peripheral vascular disease, unspecified: Secondary | ICD-10-CM | POA: Diagnosis not present

## 2021-02-15 DIAGNOSIS — L84 Corns and callosities: Secondary | ICD-10-CM | POA: Diagnosis not present

## 2021-03-15 ENCOUNTER — Other Ambulatory Visit: Payer: Self-pay

## 2021-03-15 ENCOUNTER — Ambulatory Visit: Payer: Medicare HMO | Admitting: Internal Medicine

## 2021-03-15 ENCOUNTER — Encounter: Payer: Self-pay | Admitting: Internal Medicine

## 2021-03-15 VITALS — BP 150/74 | HR 58 | Ht 72.0 in | Wt 273.0 lb

## 2021-03-15 DIAGNOSIS — I890 Lymphedema, not elsewhere classified: Secondary | ICD-10-CM

## 2021-03-15 DIAGNOSIS — E7800 Pure hypercholesterolemia, unspecified: Secondary | ICD-10-CM

## 2021-03-15 MED ORDER — FUROSEMIDE 20 MG PO TABS: 20.0000 mg | ORAL_TABLET | Freq: Every day | ORAL | 3 refills | Status: AC

## 2021-03-15 NOTE — Progress Notes (Addendum)
OFFICE CONSULT NOTE  Chief Complaint:  Edema  Primary Care Physician: Alroy Dust, L.Marlou Sa, MD  HPI:  Charles Peck is a 50 y.o. male who is being seen today for the evaluation of edema at the request of [name], MD. this is a pleasant 50 year old male kindly referred for lower extremity edema.  He says this has been going on for years.  He has a history of multiple orthopedic issues and has had bilateral knee and hip surgery and recent neck surgery.  He walks with a cane.  The swelling has been pretty much persistent and does not necessarily improve with elevating his legs.  He had an echo in 2018 which was unremarkable.  He had recent lab work including a BNP which was low.  Heart failure was not thought to be the cause of this however his PCP had requested a repeat echo.  He denies any worsening shortness of breath, orthopnea, PND or other heart failure symptoms.  He has no chest pain.  Of note his cholesterol is extremely high with total of 256, LDL 195 HDL 52 and triglycerides 57.  He says this is not a lifelong problem although recently he was significantly overweight and had lost over 100 pounds.  He has not been compliant with atorvastatin but was given a new prescription of 40 mg daily and says that he will be compliant with it.  It sounds like he needs dietary education as well as he has a significant amount of saturated fats in his diet.  EKG today shows sinus bradycardia first-degree AV block.  PMHx:  Past Medical History:  Diagnosis Date   Degenerative joint disease    Headache    not in a while     RA (rheumatoid arthritis) ([place])    Sleep apnea 2018   Testicle swelling 03-29-13   exploratory surgery planned of right testicle   UTI (urinary tract infection)     Past Surgical History:  Procedure Laterality Date   ANTERIOR CERVICAL DECOMP/DISCECTOMY FUSION N/A 02/03/2020   Procedure: Anterior Cervical Discectomy Fusion - Cervical Three- Cervical Four - Cervical Four-  Cervical Five;  Surgeon: Kary Kos, MD;  Location: [facility];  Service: Neurosurgery;  Laterality: N/A;  Anterior Cervical Discectomy Fusion - Cervical Three- Cervical Four - Cervical Four- Cervical Five   BACK SURGERY     BUNIONECTOMY  2012   also foot surgery to both feet   CERVICAL FUSION  2005   CYSTOSCOPY N/A 04/01/2013   Procedure: CYSTOSCOPY FLEXIBLE EXCISIONAL BIOPSY ;  Surgeon: Fredricka Bonine, MD;  Location: [facility];  Service: Urology;  Laterality: N/A;  EXCISIONAL BIOPSY    HIP ARTHROPLASTY  2011   bilateral 1 month   LUMBAR DISC SURGERY  2009   TESTICULAR EXPLORATION Right 04/01/2013   Procedure: RIGHT INGUINAL EXPLORATION;  Surgeon: Fredricka Bonine, MD;  Location: [facility];  Service: Urology;  Laterality: Right;  RIGHT INGUINAL EXPLORATION   TOTAL KNEE ARTHROPLASTY  01/12/2012   Procedure: TOTAL KNEE ARTHROPLASTY;  Surgeon: Kerin Salen, MD;  Location: [facility];  Service: Orthopedics;  Laterality: Right;   TOTAL KNEE REVISION Right 05/02/2015   TOTAL KNEE REVISION Right 05/02/2015   Procedure: ONE COMPONENT KNEE REVISION;  Surgeon: Frederik Pear, MD;  Location: [facility];  Service: Orthopedics;  Laterality: Right;    FAMHx:  Family History  Problem Relation Age of Onset   Breast cancer Mother    Epilepsy Brother    Breast cancer Other  Epilepsy Other     SOCHx:   reports that he quit smoking about 25 years ago. His smoking use included cigarettes. He has never used smokeless tobacco. He reports that he does not drink alcohol and does not use drugs.  ALLERGIES:  No Known Allergies  ROS: Pertinent items noted in HPI and remainder of comprehensive ROS otherwise negative.  HOME MEDS: Current Outpatient Medications on File Prior to Visit  Medication Sig Dispense Refill   CIMZIA PREFILLED 2 X 200 MG/ML KIT Inject 400 mg into the skin every 28 (twenty-eight) days.      oxyCODONE (ROXICODONE) 30 MG immediate release tablet Take 1 tablet (30 mg total) by mouth 3 (three)  times daily. 30 tablet 0   atorvastatin (LIPITOR) 40 MG tablet 1 tablet (Patient not taking: Reported on 03/15/2021)     gabapentin (NEURONTIN) 300 MG capsule Take 300-600 mg by mouth at bedtime. (Patient not taking: Reported on 03/15/2021)     hydroxychloroquine (PLAQUENIL) 200 MG tablet Take 200 mg by mouth in the morning and at bedtime. (Patient not taking: Reported on 03/15/2021)     methocarbamol (ROBAXIN) 500 MG tablet Take 1 tablet (500 mg total) by mouth every 8 (eight) hours as needed for muscle spasms. (Patient not taking: Reported on 03/15/2021) 90 tablet 1   No current facility-administered medications on file prior to visit.    LABS/IMAGING: No results found for this or any previous visit (from the past 48 hour(s)). No results found.  LIPID PANEL: No results found for: CHOL, TRIG, HDL, CHOLHDL, VLDL, LDLCALC, LDLDIRECT  WEIGHTS: Wt Readings from Last 3 Encounters:  03/15/21 273 lb (123.8 kg)  02/03/20 (!) 350 lb 1.5 oz (158.8 kg)  02/01/20 (!) 350 lb (158.8 kg)    VITALS: BP (!) 150/74    Pulse (!) 58    Ht 6' (1.829 m)    Wt 273 lb (123.8 kg)    SpO2 99%    BMI 37.03 kg/m   EXAM: General appearance: alert, no distress, and morbidly obese Neck: no carotid bruit, no JVD, and thyroid not enlarged, symmetric, no tenderness/mass/nodules Lungs: clear to auscultation bilaterally Heart: regular rate and rhythm, S1, S2 normal, no murmur, click, rub or gallop Abdomen: soft, non-tender; bowel sounds normal; no masses,  no organomegaly and obese Extremities: edema 3+ bilateral lymphedema Pulses: 2+ and symmetric Skin: Skin color, texture, turgor normal. No rashes or lesions Neurologic: Grossly normal Psych: Pleasant  EKG: Sinus bradycardia first-degree AV block at 58- personally reviewed  ASSESSMENT: Bilateral lower extremity lymphedema LDL cholesterol greater than 190, possible FH RA with multiple orthopedic issues and surgeries  PLAN: 1.   [name] has bilateral  lower extremity lymphedema which is fairly classic and has been present for over a year.  This is probably related to multiple orthopedic surgeries.  He also carries diagnosis of rheumatoid arthritis.  This is a risk factor for heart disease and concerning because his LDL cholesterol was over 190.  I stressed the importance of compliance with his atorvastatin and he said he would work on that.  We will provide dietary tips as well.  He will need a repeat lipid in 3 months.  We will go ahead and try low-dose Lasix 20 mg daily however have to be careful not to worsen his kidney function.  Repeat BMET in 2 to 4 weeks.  I will go ahead and provide a prescription for lower extremity lymphedema pumps which he can get from a medical supply company. He  has tried 4 weeks of exercise, elevation and compression without resolution of symptoms, therefore, would be considered a candidate for the device.  I also discussed the possible calcium score however did not seem interested at this time.  We will revisit that and follow-up in 3 months.  Thanks again for the kind referral.  Pixie Casino, MD, FACC, [place] Director of the [facility]  [facility] Diplomate of the American Board of Clinical Lipidology Attending Cardiologist  Direct Dial: [phone]   Fax: [phone]  Website:  [email or web address]  Nadean Corwin Danee Soller 03/15/2021, 9:25 AM

## 2021-03-15 NOTE — Patient Instructions (Signed)
Medication Instructions:  TAKE atorvastatin as prescribed START furosemide (lasix) 20mg  daily in the morning for fluid/swelling   *If you need a refill on your cardiac medications before your next appointment, please call your pharmacy*   Lab Work: NON-FASTING BMET in 2 weeks  FASTING LIPID PANEL in 3-4 months  If you have labs (blood work) drawn today and your tests are completely normal, you will receive your results only by: MyChart Message (if you have MyChart) OR A paper copy in the mail If you have any lab test that is abnormal or we need to change your treatment, we will call you to review the results.   Testing/Procedures: Echocardiogram @ [address] 3rd Floor [place] [facility]) You will get a call next week to schedule   Follow-Up: At [facility], you and your health needs are our priority.  As part of our continuing mission to provide you with exceptional heart care, we have created designated Provider Care Teams.  These Care Teams include your primary Cardiologist (physician) and Advanced Practice Providers (APPs -  Physician Assistants and Nurse Practitioners) who all work together to provide you with the care you need, when you need it.  We recommend signing up for the patient portal called "MyChart".  Sign up information is provided on this After Visit Summary.  MyChart is used to connect with patients for Virtual Visits (Telemedicine).  Patients are able to view lab/test results, encounter notes, upcoming appointments, etc.  Non-urgent messages can be sent to your provider as well.   To learn more about what you can do with MyChart, go to [facility].    Your next appointment:   3-4 months with Dr. [email or web address]    Other Instructions Dr. Rennis Golden has ordered a lymphedema pump. We will work on getting this coordinated, likely during a DME company

## 2021-03-19 ENCOUNTER — Telehealth: Payer: Self-pay | Admitting: Internal Medicine

## 2021-03-19 NOTE — Telephone Encounter (Signed)
Faxed packet of info for lymphedema pump Left message for patient to call back to discuss

## 2021-03-19 NOTE — Telephone Encounter (Signed)
PT returning a phone call please advise

## 2021-03-19 NOTE — Telephone Encounter (Signed)
Called patient, LVM advising to call back, I did not see any missed calls at this time.  Left call back number.

## 2021-03-19 NOTE — Telephone Encounter (Signed)
Received contact for [employer] rep Bennie Hind (from [employer]) (Phone) [phone] (Fax) [phone]  Called rep and was advised to fax demographics, insurance card, and last office note to him. He will run insurance/check benefits, speak with patient about the device/do demo, and then fax over order for MD to sign.

## 2021-03-20 DIAGNOSIS — Z981 Arthrodesis status: Secondary | ICD-10-CM | POA: Diagnosis not present

## 2021-03-20 DIAGNOSIS — F1120 Opioid dependence, uncomplicated: Secondary | ICD-10-CM | POA: Diagnosis not present

## 2021-03-20 DIAGNOSIS — M5136 Other intervertebral disc degeneration, lumbar region: Secondary | ICD-10-CM | POA: Diagnosis not present

## 2021-03-25 DIAGNOSIS — I739 Peripheral vascular disease, unspecified: Secondary | ICD-10-CM | POA: Diagnosis not present

## 2021-03-25 DIAGNOSIS — L603 Nail dystrophy: Secondary | ICD-10-CM | POA: Diagnosis not present

## 2021-03-25 DIAGNOSIS — L84 Corns and callosities: Secondary | ICD-10-CM | POA: Diagnosis not present

## 2021-04-01 ENCOUNTER — Ambulatory Visit (HOSPITAL_COMMUNITY): Payer: Medicare HMO | Attending: Cardiology

## 2021-04-01 ENCOUNTER — Encounter (HOSPITAL_COMMUNITY): Payer: Self-pay | Admitting: Internal Medicine

## 2021-04-02 ENCOUNTER — Encounter (HOSPITAL_COMMUNITY): Payer: Self-pay | Admitting: Internal Medicine

## 2021-04-18 DIAGNOSIS — I739 Peripheral vascular disease, unspecified: Secondary | ICD-10-CM | POA: Diagnosis not present

## 2021-04-18 DIAGNOSIS — L84 Corns and callosities: Secondary | ICD-10-CM | POA: Diagnosis not present

## 2021-04-18 DIAGNOSIS — L603 Nail dystrophy: Secondary | ICD-10-CM | POA: Diagnosis not present

## 2021-05-16 DIAGNOSIS — L603 Nail dystrophy: Secondary | ICD-10-CM | POA: Diagnosis not present

## 2021-05-16 DIAGNOSIS — I739 Peripheral vascular disease, unspecified: Secondary | ICD-10-CM | POA: Diagnosis not present

## 2021-05-16 DIAGNOSIS — L84 Corns and callosities: Secondary | ICD-10-CM | POA: Diagnosis not present

## 2021-05-16 NOTE — Telephone Encounter (Signed)
Faxed signed letter of medical necessity to [facility] to [phone] ?

## 2021-05-22 NOTE — Telephone Encounter (Signed)
Fax to [facility] has failed on numerous occasions. Called liaison Magdalene Patricia and she advised to send secure email with the form.  ?

## 2021-05-31 DIAGNOSIS — I739 Peripheral vascular disease, unspecified: Secondary | ICD-10-CM | POA: Diagnosis not present

## 2021-05-31 DIAGNOSIS — L84 Corns and callosities: Secondary | ICD-10-CM | POA: Diagnosis not present

## 2021-06-13 DIAGNOSIS — M4802 Spinal stenosis, cervical region: Secondary | ICD-10-CM | POA: Diagnosis not present

## 2021-06-13 DIAGNOSIS — M5136 Other intervertebral disc degeneration, lumbar region: Secondary | ICD-10-CM | POA: Diagnosis not present

## 2021-06-13 DIAGNOSIS — M5416 Radiculopathy, lumbar region: Secondary | ICD-10-CM | POA: Diagnosis not present

## 2021-06-13 DIAGNOSIS — F1120 Opioid dependence, uncomplicated: Secondary | ICD-10-CM | POA: Diagnosis not present

## 2021-06-17 DIAGNOSIS — Z23 Encounter for immunization: Secondary | ICD-10-CM | POA: Diagnosis not present

## 2021-06-17 DIAGNOSIS — M069 Rheumatoid arthritis, unspecified: Secondary | ICD-10-CM | POA: Diagnosis not present

## 2021-06-17 DIAGNOSIS — Z1211 Encounter for screening for malignant neoplasm of colon: Secondary | ICD-10-CM | POA: Diagnosis not present

## 2021-06-17 DIAGNOSIS — Z125 Encounter for screening for malignant neoplasm of prostate: Secondary | ICD-10-CM | POA: Diagnosis not present

## 2021-06-17 DIAGNOSIS — Z0000 Encounter for general adult medical examination without abnormal findings: Secondary | ICD-10-CM | POA: Diagnosis not present

## 2021-06-17 DIAGNOSIS — I890 Lymphedema, not elsewhere classified: Secondary | ICD-10-CM | POA: Diagnosis not present

## 2021-06-17 DIAGNOSIS — M5440 Lumbago with sciatica, unspecified side: Secondary | ICD-10-CM | POA: Diagnosis not present

## 2021-06-17 DIAGNOSIS — E7800 Pure hypercholesterolemia, unspecified: Secondary | ICD-10-CM | POA: Diagnosis not present

## 2021-06-25 DIAGNOSIS — I739 Peripheral vascular disease, unspecified: Secondary | ICD-10-CM | POA: Diagnosis not present

## 2021-06-25 DIAGNOSIS — L84 Corns and callosities: Secondary | ICD-10-CM | POA: Diagnosis not present

## 2021-07-03 DIAGNOSIS — M064 Inflammatory polyarthropathy: Secondary | ICD-10-CM | POA: Diagnosis not present

## 2021-07-03 DIAGNOSIS — Z79899 Other long term (current) drug therapy: Secondary | ICD-10-CM | POA: Diagnosis not present

## 2021-07-03 DIAGNOSIS — M0609 Rheumatoid arthritis without rheumatoid factor, multiple sites: Secondary | ICD-10-CM | POA: Diagnosis not present

## 2021-07-03 DIAGNOSIS — E785 Hyperlipidemia, unspecified: Secondary | ICD-10-CM | POA: Diagnosis not present

## 2021-07-03 DIAGNOSIS — M1990 Unspecified osteoarthritis, unspecified site: Secondary | ICD-10-CM | POA: Diagnosis not present

## 2021-07-15 DIAGNOSIS — M0609 Rheumatoid arthritis without rheumatoid factor, multiple sites: Secondary | ICD-10-CM | POA: Diagnosis not present

## 2021-07-17 DIAGNOSIS — I739 Peripheral vascular disease, unspecified: Secondary | ICD-10-CM | POA: Diagnosis not present

## 2021-07-17 DIAGNOSIS — L84 Corns and callosities: Secondary | ICD-10-CM | POA: Diagnosis not present

## 2021-08-16 DIAGNOSIS — I739 Peripheral vascular disease, unspecified: Secondary | ICD-10-CM | POA: Diagnosis not present

## 2021-08-16 DIAGNOSIS — L84 Corns and callosities: Secondary | ICD-10-CM | POA: Diagnosis not present

## 2021-08-26 DIAGNOSIS — F1120 Opioid dependence, uncomplicated: Secondary | ICD-10-CM | POA: Diagnosis not present

## 2021-08-26 DIAGNOSIS — M4802 Spinal stenosis, cervical region: Secondary | ICD-10-CM | POA: Diagnosis not present

## 2021-08-26 DIAGNOSIS — Z6828 Body mass index (BMI) 28.0-28.9, adult: Secondary | ICD-10-CM | POA: Diagnosis not present

## 2021-08-26 DIAGNOSIS — M5136 Other intervertebral disc degeneration, lumbar region: Secondary | ICD-10-CM | POA: Diagnosis not present

## 2021-08-29 DIAGNOSIS — M0609 Rheumatoid arthritis without rheumatoid factor, multiple sites: Secondary | ICD-10-CM | POA: Diagnosis not present

## 2021-09-12 DIAGNOSIS — I739 Peripheral vascular disease, unspecified: Secondary | ICD-10-CM | POA: Diagnosis not present

## 2021-09-12 DIAGNOSIS — L84 Corns and callosities: Secondary | ICD-10-CM | POA: Diagnosis not present

## 2021-09-26 DIAGNOSIS — M0609 Rheumatoid arthritis without rheumatoid factor, multiple sites: Secondary | ICD-10-CM | POA: Diagnosis not present

## 2021-10-02 DIAGNOSIS — M0609 Rheumatoid arthritis without rheumatoid factor, multiple sites: Secondary | ICD-10-CM | POA: Diagnosis not present

## 2021-10-02 DIAGNOSIS — M064 Inflammatory polyarthropathy: Secondary | ICD-10-CM | POA: Diagnosis not present

## 2021-10-02 DIAGNOSIS — Z79899 Other long term (current) drug therapy: Secondary | ICD-10-CM | POA: Diagnosis not present

## 2021-10-02 DIAGNOSIS — M1990 Unspecified osteoarthritis, unspecified site: Secondary | ICD-10-CM | POA: Diagnosis not present

## 2021-10-02 DIAGNOSIS — E785 Hyperlipidemia, unspecified: Secondary | ICD-10-CM | POA: Diagnosis not present

## 2021-10-14 DIAGNOSIS — I739 Peripheral vascular disease, unspecified: Secondary | ICD-10-CM | POA: Diagnosis not present

## 2021-10-14 DIAGNOSIS — L84 Corns and callosities: Secondary | ICD-10-CM | POA: Diagnosis not present

## 2021-10-24 DIAGNOSIS — M0609 Rheumatoid arthritis without rheumatoid factor, multiple sites: Secondary | ICD-10-CM | POA: Diagnosis not present

## 2021-11-12 DIAGNOSIS — M5136 Other intervertebral disc degeneration, lumbar region: Secondary | ICD-10-CM | POA: Diagnosis not present

## 2021-11-12 DIAGNOSIS — M4802 Spinal stenosis, cervical region: Secondary | ICD-10-CM | POA: Diagnosis not present

## 2021-11-12 DIAGNOSIS — F1120 Opioid dependence, uncomplicated: Secondary | ICD-10-CM | POA: Diagnosis not present

## 2021-11-15 DIAGNOSIS — I739 Peripheral vascular disease, unspecified: Secondary | ICD-10-CM | POA: Diagnosis not present

## 2021-11-15 DIAGNOSIS — L84 Corns and callosities: Secondary | ICD-10-CM | POA: Diagnosis not present

## 2021-11-15 DIAGNOSIS — L603 Nail dystrophy: Secondary | ICD-10-CM | POA: Diagnosis not present

## 2021-11-21 DIAGNOSIS — M0609 Rheumatoid arthritis without rheumatoid factor, multiple sites: Secondary | ICD-10-CM | POA: Diagnosis not present

## 2021-12-04 DIAGNOSIS — M064 Inflammatory polyarthropathy: Secondary | ICD-10-CM | POA: Diagnosis not present

## 2021-12-04 DIAGNOSIS — Z79899 Other long term (current) drug therapy: Secondary | ICD-10-CM | POA: Diagnosis not present

## 2021-12-04 DIAGNOSIS — M0609 Rheumatoid arthritis without rheumatoid factor, multiple sites: Secondary | ICD-10-CM | POA: Diagnosis not present

## 2021-12-04 DIAGNOSIS — M1990 Unspecified osteoarthritis, unspecified site: Secondary | ICD-10-CM | POA: Diagnosis not present

## 2021-12-04 DIAGNOSIS — E785 Hyperlipidemia, unspecified: Secondary | ICD-10-CM | POA: Diagnosis not present

## 2021-12-17 ENCOUNTER — Other Ambulatory Visit (HOSPITAL_COMMUNITY): Payer: Self-pay | Admitting: Orthopedic Surgery

## 2021-12-17 DIAGNOSIS — M25561 Pain in right knee: Secondary | ICD-10-CM | POA: Diagnosis not present

## 2021-12-19 DIAGNOSIS — M0609 Rheumatoid arthritis without rheumatoid factor, multiple sites: Secondary | ICD-10-CM | POA: Diagnosis not present

## 2021-12-31 ENCOUNTER — Encounter (HOSPITAL_COMMUNITY): Payer: Medicare HMO

## 2022-01-08 ENCOUNTER — Encounter (HOSPITAL_COMMUNITY): Payer: Medicare HMO

## 2022-01-08 ENCOUNTER — Encounter (HOSPITAL_COMMUNITY): Payer: Self-pay

## 2022-01-08 ENCOUNTER — Encounter (HOSPITAL_COMMUNITY)
Admission: RE | Admit: 2022-01-08 | Discharge: 2022-01-08 | Disposition: A | Discharge status: Home / Self Care | Payer: Medicare HMO | Source: Ambulatory Visit | Attending: Orthopedic Surgery | Admitting: Orthopedic Surgery

## 2022-01-08 DIAGNOSIS — M25561 Pain in right knee: Secondary | ICD-10-CM | POA: Insufficient documentation

## 2022-01-16 DIAGNOSIS — M0609 Rheumatoid arthritis without rheumatoid factor, multiple sites: Secondary | ICD-10-CM | POA: Diagnosis not present

## 2022-01-31 ENCOUNTER — Encounter (HOSPITAL_COMMUNITY): Payer: Self-pay

## 2022-01-31 ENCOUNTER — Encounter (HOSPITAL_COMMUNITY): Admission: RE | Admit: 2022-01-31 | Payer: Medicare HMO | Source: Ambulatory Visit

## 2022-01-31 ENCOUNTER — Encounter (HOSPITAL_COMMUNITY): Payer: Medicare HMO

## 2022-02-04 DIAGNOSIS — F1120 Opioid dependence, uncomplicated: Secondary | ICD-10-CM | POA: Diagnosis not present

## 2022-02-04 DIAGNOSIS — Z6828 Body mass index (BMI) 28.0-28.9, adult: Secondary | ICD-10-CM | POA: Diagnosis not present

## 2022-02-04 DIAGNOSIS — M5136 Other intervertebral disc degeneration, lumbar region: Secondary | ICD-10-CM | POA: Diagnosis not present

## 2022-02-04 DIAGNOSIS — M4802 Spinal stenosis, cervical region: Secondary | ICD-10-CM | POA: Diagnosis not present

## 2022-02-13 DIAGNOSIS — M0609 Rheumatoid arthritis without rheumatoid factor, multiple sites: Secondary | ICD-10-CM | POA: Diagnosis not present

## 2022-02-26 ENCOUNTER — Encounter (HOSPITAL_COMMUNITY)
Admission: RE | Admit: 2022-02-26 | Discharge: 2022-02-26 | Disposition: A | Discharge status: Home / Self Care | Payer: Medicare HMO | Source: Ambulatory Visit | Attending: Orthopedic Surgery | Admitting: Orthopedic Surgery

## 2022-02-26 ENCOUNTER — Ambulatory Visit (HOSPITAL_COMMUNITY)
Admission: RE | Admit: 2022-02-26 | Discharge: 2022-02-26 | Disposition: A | Discharge status: Home / Self Care | Payer: Medicare HMO | Source: Ambulatory Visit | Attending: Orthopedic Surgery | Admitting: Orthopedic Surgery

## 2022-02-26 DIAGNOSIS — Z96651 Presence of right artificial knee joint: Secondary | ICD-10-CM | POA: Insufficient documentation

## 2022-02-26 DIAGNOSIS — M1712 Unilateral primary osteoarthritis, left knee: Secondary | ICD-10-CM | POA: Diagnosis not present

## 2022-02-26 DIAGNOSIS — M25561 Pain in right knee: Secondary | ICD-10-CM | POA: Diagnosis not present

## 2022-02-26 DIAGNOSIS — M069 Rheumatoid arthritis, unspecified: Secondary | ICD-10-CM | POA: Diagnosis not present

## 2022-02-26 MED ORDER — TECHNETIUM TC 99M MEDRONATE IV KIT
20.0000 | PACK | Freq: Once | INTRAVENOUS | Status: AC | PRN
  Administered 2022-02-26: 20 via INTRAVENOUS

## 2022-03-04 DIAGNOSIS — M25561 Pain in right knee: Secondary | ICD-10-CM | POA: Diagnosis not present

## 2022-03-05 DIAGNOSIS — M1990 Unspecified osteoarthritis, unspecified site: Secondary | ICD-10-CM | POA: Diagnosis not present

## 2022-03-05 DIAGNOSIS — M064 Inflammatory polyarthropathy: Secondary | ICD-10-CM | POA: Diagnosis not present

## 2022-03-05 DIAGNOSIS — Z79899 Other long term (current) drug therapy: Secondary | ICD-10-CM | POA: Diagnosis not present

## 2022-03-05 DIAGNOSIS — M0609 Rheumatoid arthritis without rheumatoid factor, multiple sites: Secondary | ICD-10-CM | POA: Diagnosis not present

## 2022-03-05 DIAGNOSIS — E785 Hyperlipidemia, unspecified: Secondary | ICD-10-CM | POA: Diagnosis not present

## 2022-03-13 DIAGNOSIS — M0609 Rheumatoid arthritis without rheumatoid factor, multiple sites: Secondary | ICD-10-CM | POA: Diagnosis not present

## 2022-04-10 DIAGNOSIS — M0609 Rheumatoid arthritis without rheumatoid factor, multiple sites: Secondary | ICD-10-CM | POA: Diagnosis not present

## 2022-04-16 DIAGNOSIS — F1120 Opioid dependence, uncomplicated: Secondary | ICD-10-CM | POA: Diagnosis not present

## 2022-04-16 DIAGNOSIS — M5136 Other intervertebral disc degeneration, lumbar region: Secondary | ICD-10-CM | POA: Diagnosis not present

## 2022-04-16 DIAGNOSIS — M4802 Spinal stenosis, cervical region: Secondary | ICD-10-CM | POA: Diagnosis not present

## 2022-05-08 DIAGNOSIS — Z79899 Other long term (current) drug therapy: Secondary | ICD-10-CM | POA: Diagnosis not present

## 2022-05-08 DIAGNOSIS — E785 Hyperlipidemia, unspecified: Secondary | ICD-10-CM | POA: Diagnosis not present

## 2022-05-08 DIAGNOSIS — M0609 Rheumatoid arthritis without rheumatoid factor, multiple sites: Secondary | ICD-10-CM | POA: Diagnosis not present

## 2022-05-08 DIAGNOSIS — M1990 Unspecified osteoarthritis, unspecified site: Secondary | ICD-10-CM | POA: Diagnosis not present

## 2022-05-08 DIAGNOSIS — M064 Inflammatory polyarthropathy: Secondary | ICD-10-CM | POA: Diagnosis not present

## 2022-06-26 DIAGNOSIS — D84821 Immunodeficiency due to drugs: Secondary | ICD-10-CM | POA: Diagnosis not present

## 2022-06-26 DIAGNOSIS — Z125 Encounter for screening for malignant neoplasm of prostate: Secondary | ICD-10-CM | POA: Diagnosis not present

## 2022-06-26 DIAGNOSIS — Z0000 Encounter for general adult medical examination without abnormal findings: Secondary | ICD-10-CM | POA: Diagnosis not present

## 2022-06-26 DIAGNOSIS — Z1211 Encounter for screening for malignant neoplasm of colon: Secondary | ICD-10-CM | POA: Diagnosis not present

## 2022-06-26 DIAGNOSIS — E7800 Pure hypercholesterolemia, unspecified: Secondary | ICD-10-CM | POA: Diagnosis not present

## 2022-06-26 DIAGNOSIS — M0609 Rheumatoid arthritis without rheumatoid factor, multiple sites: Secondary | ICD-10-CM | POA: Diagnosis not present

## 2022-07-10 DIAGNOSIS — M5136 Other intervertebral disc degeneration, lumbar region: Secondary | ICD-10-CM | POA: Diagnosis not present

## 2022-07-10 DIAGNOSIS — M5416 Radiculopathy, lumbar region: Secondary | ICD-10-CM | POA: Diagnosis not present

## 2022-07-10 DIAGNOSIS — M4802 Spinal stenosis, cervical region: Secondary | ICD-10-CM | POA: Diagnosis not present

## 2022-07-10 DIAGNOSIS — F1120 Opioid dependence, uncomplicated: Secondary | ICD-10-CM | POA: Diagnosis not present

## 2022-08-12 DIAGNOSIS — M0609 Rheumatoid arthritis without rheumatoid factor, multiple sites: Secondary | ICD-10-CM | POA: Diagnosis not present

## 2022-08-29 IMAGING — RF DG C-ARM 1-60 MIN
1 series · 1 of 1 positions shown · non-contrast
Comparison: Cervical spine MRI 01/09/2020.

CLINICAL DATA: Elective surgery. Additional history provided: ACDF
C3-C5. Provided fluoroscopy time 6 seconds (0.70 mGy).

EXAM:
CERVICAL SPINE - 2-3 VIEW; DG C-ARM 1-60 MIN

[Series 1: run · 1 of 1 slices shown]
[im 1/1]
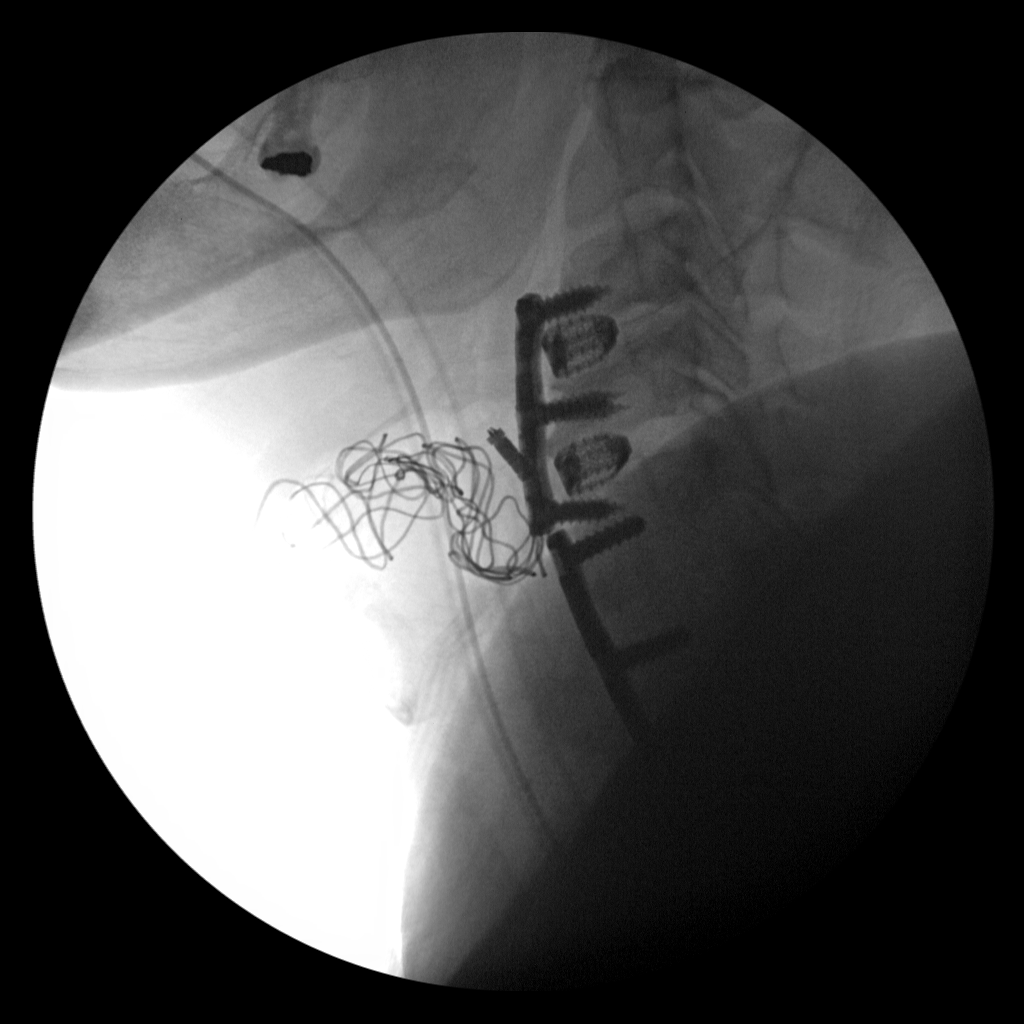

[1 of 1 positions shown; findings below may reference images not displayed]

FINDINGS: A single lateral view intraoperative fluoroscopic image of the
cervical spine is submitted. ACDF hardware is now present at the
C3-C5 levels (ventral plate and screws with C3-C4 and C4-C5
interbody devices). Redemonstrated ACDF hardware at the C5-C7
levels. Curvilinear hyperdensity projects ventral to the cervical
spine. Partially visualized ET tube.
IMPRESSION: Single lateral view intraoperative fluoroscopic image of the
cervical spine, as described.

Curvilinear hyperdensity projects ventral to the cervical spine,
which may reflect packing material or surgical sponge. Correlate
with the operative history.

## 2022-09-09 DIAGNOSIS — M0609 Rheumatoid arthritis without rheumatoid factor, multiple sites: Secondary | ICD-10-CM | POA: Diagnosis not present

## 2022-09-15 DIAGNOSIS — M4802 Spinal stenosis, cervical region: Secondary | ICD-10-CM | POA: Diagnosis not present

## 2022-09-15 DIAGNOSIS — Z6830 Body mass index (BMI) 30.0-30.9, adult: Secondary | ICD-10-CM | POA: Diagnosis not present

## 2022-09-15 DIAGNOSIS — F1120 Opioid dependence, uncomplicated: Secondary | ICD-10-CM | POA: Diagnosis not present

## 2022-09-15 DIAGNOSIS — M5416 Radiculopathy, lumbar region: Secondary | ICD-10-CM | POA: Diagnosis not present

## 2022-09-15 DIAGNOSIS — M5136 Other intervertebral disc degeneration, lumbar region: Secondary | ICD-10-CM | POA: Diagnosis not present

## 2022-09-23 DIAGNOSIS — I739 Peripheral vascular disease, unspecified: Secondary | ICD-10-CM | POA: Diagnosis not present

## 2022-09-23 DIAGNOSIS — L84 Corns and callosities: Secondary | ICD-10-CM | POA: Diagnosis not present

## 2022-10-09 DIAGNOSIS — M0609 Rheumatoid arthritis without rheumatoid factor, multiple sites: Secondary | ICD-10-CM | POA: Diagnosis not present

## 2022-10-17 DIAGNOSIS — L603 Nail dystrophy: Secondary | ICD-10-CM | POA: Diagnosis not present

## 2022-10-17 DIAGNOSIS — I739 Peripheral vascular disease, unspecified: Secondary | ICD-10-CM | POA: Diagnosis not present

## 2022-10-17 DIAGNOSIS — L84 Corns and callosities: Secondary | ICD-10-CM | POA: Diagnosis not present

## 2022-11-06 DIAGNOSIS — M0609 Rheumatoid arthritis without rheumatoid factor, multiple sites: Secondary | ICD-10-CM | POA: Diagnosis not present

## 2022-11-21 DIAGNOSIS — L84 Corns and callosities: Secondary | ICD-10-CM | POA: Diagnosis not present

## 2022-11-21 DIAGNOSIS — I739 Peripheral vascular disease, unspecified: Secondary | ICD-10-CM | POA: Diagnosis not present

## 2022-12-04 DIAGNOSIS — M0609 Rheumatoid arthritis without rheumatoid factor, multiple sites: Secondary | ICD-10-CM | POA: Diagnosis not present

## 2022-12-17 DIAGNOSIS — L84 Corns and callosities: Secondary | ICD-10-CM | POA: Diagnosis not present

## 2022-12-17 DIAGNOSIS — L603 Nail dystrophy: Secondary | ICD-10-CM | POA: Diagnosis not present

## 2022-12-17 DIAGNOSIS — I739 Peripheral vascular disease, unspecified: Secondary | ICD-10-CM | POA: Diagnosis not present

## 2023-01-01 DIAGNOSIS — M0609 Rheumatoid arthritis without rheumatoid factor, multiple sites: Secondary | ICD-10-CM | POA: Diagnosis not present

## 2023-01-21 DIAGNOSIS — I739 Peripheral vascular disease, unspecified: Secondary | ICD-10-CM | POA: Diagnosis not present

## 2023-01-21 DIAGNOSIS — L84 Corns and callosities: Secondary | ICD-10-CM | POA: Diagnosis not present

## 2023-01-29 DIAGNOSIS — M0609 Rheumatoid arthritis without rheumatoid factor, multiple sites: Secondary | ICD-10-CM | POA: Diagnosis not present

## 2023-03-02 DIAGNOSIS — M0609 Rheumatoid arthritis without rheumatoid factor, multiple sites: Secondary | ICD-10-CM | POA: Diagnosis not present

## 2023-08-24 ENCOUNTER — Encounter: Payer: Self-pay | Admitting: Podiatry

## 2023-08-24 ENCOUNTER — Ambulatory Visit: Admitting: Podiatry

## 2023-08-24 DIAGNOSIS — I739 Peripheral vascular disease, unspecified: Secondary | ICD-10-CM | POA: Diagnosis not present

## 2023-08-24 DIAGNOSIS — M79672 Pain in left foot: Secondary | ICD-10-CM

## 2023-08-24 DIAGNOSIS — B351 Tinea unguium: Secondary | ICD-10-CM

## 2023-08-24 DIAGNOSIS — L84 Corns and callosities: Secondary | ICD-10-CM

## 2023-08-24 DIAGNOSIS — M79671 Pain in right foot: Secondary | ICD-10-CM | POA: Diagnosis not present

## 2023-08-24 NOTE — Progress Notes (Signed)
 Patient presents for evaluation and treatment of tenderness and some redness around nails feet.  Tenderness around toes with walking and wearing shoes.  Painful hyperkeratotic lesions feet.  Pain with walking  Physical exam:  General appearance: Alert, pleasant, and in no acute distress.  Vascular: Pedal pulses: DP 2/4 B/L, PT 0/4 B/L.  Moderate edema lower legs bilaterally  Neurological:  No burning or paresthesias noted  Dermatologic:  Nails thickened, disfigured, discolored 1-5 BL with subungual debris.  Redness and hypertrophic nail folds along nail folds bilaterally but no signs of drainage or infection.  Painful preulcerative hyperkeratosis medial aspect second toe left at PIPJ, plantar arch left foot, plantar second metatarsal head right foot, and dorsal aspect proximal inner phalangeal joint fifth toe.  Skin thin and atrophic with areas of hyperpigmentation and no hair growth lower extremity  Musculoskeletal:  Severe hammertoe deformities 1 through 5 bilaterally laterally.   Diagnosis: 1. Painful onychomycotic nails 1 through 5 bilaterally. 2. Pain toes 1 through 5 bilaterally. 3.  Painful calluses x 4 feet bilaterally 4.  PVD 5. Q8 modifier  Plan: -Debrided onychomycotic nails 1 through 5 bilaterally. -Debridement painful calluses x 4 feet bilaterally  Return 3 months

## 2023-11-23 ENCOUNTER — Ambulatory Visit: Admitting: Podiatry

## 2023-11-26 ENCOUNTER — Ambulatory Visit: Admitting: Podiatry

## 2023-11-26 DIAGNOSIS — M79672 Pain in left foot: Secondary | ICD-10-CM | POA: Diagnosis not present

## 2023-11-26 DIAGNOSIS — B351 Tinea unguium: Secondary | ICD-10-CM

## 2023-11-26 DIAGNOSIS — M79671 Pain in right foot: Secondary | ICD-10-CM | POA: Diagnosis not present

## 2023-11-26 NOTE — Progress Notes (Signed)
 Patient presents for evaluation and treatment of tenderness and some redness around nails feet.  Tenderness around toes with walking and wearing shoes.  Physical exam:  General appearance: Alert, pleasant, and in no acute distress.  Vascular: Pedal pulses: DP 2/4 B/L, PT 0/4 B/L. Moderate edema lower legs bilaterally  Neu  Dermatologic:  Nails thickened, disfigured, discolored 1-5 BL with subungual debris.  Redness and hypertrophic nail folds along nail folds bilaterally but no signs of drainage or infection.  Musculoskeletal:     Diagnosis: 1. Painful onychomycotic nails 1 through 5 bilaterally. 2. Pain toes 1 through 5 bilaterally.  Plan: -Debrided onychomycotic nails 1 through 5 bilaterally.  Sharply debrided nails with nail clipper and reduced with a power bur.  Return 3 months RFC

## 2024-02-25 ENCOUNTER — Ambulatory Visit: Admitting: Podiatry
